# Patient Record
Sex: Female | Born: 1950 | Race: Black or African American | Hispanic: No | Marital: Married | Smoking: Current every day smoker
Health system: Southern US, Community
[De-identification: ages and names within clinical notes are randomized; demographics above are authoritative.]

## PROBLEM LIST (undated history)

## (undated) DIAGNOSIS — J40 Bronchitis, not specified as acute or chronic: Secondary | ICD-10-CM

## (undated) DIAGNOSIS — F419 Anxiety disorder, unspecified: Secondary | ICD-10-CM

## (undated) HISTORY — PX: HAND SURGERY: SHX662

---

## 2001-07-20 ENCOUNTER — Other Ambulatory Visit: Admission: RE | Admit: 2001-07-20 | Discharge: 2001-07-20 | Payer: Self-pay | Admitting: Family Medicine

## 2004-04-15 ENCOUNTER — Ambulatory Visit (HOSPITAL_COMMUNITY): Admission: RE | Admit: 2004-04-15 | Discharge: 2004-04-15 | Payer: Self-pay | Admitting: Family Medicine

## 2005-10-20 ENCOUNTER — Ambulatory Visit (HOSPITAL_COMMUNITY): Admission: RE | Admit: 2005-10-20 | Discharge: 2005-10-20 | Payer: Self-pay | Admitting: Family Medicine

## 2010-08-04 ENCOUNTER — Encounter: Payer: Self-pay | Admitting: Family Medicine

## 2010-08-05 ENCOUNTER — Encounter: Payer: Self-pay | Admitting: Family Medicine

## 2014-09-29 ENCOUNTER — Encounter (HOSPITAL_COMMUNITY): Payer: Self-pay | Admitting: *Deleted

## 2014-09-29 ENCOUNTER — Emergency Department (HOSPITAL_COMMUNITY)
Admission: EM | Admit: 2014-09-29 | Discharge: 2014-09-29 | Disposition: A | Discharge status: Home / Self Care | Payer: Medicare HMO | Attending: Emergency Medicine | Admitting: Emergency Medicine

## 2014-09-29 DIAGNOSIS — Y92410 Unspecified street and highway as the place of occurrence of the external cause: Secondary | ICD-10-CM | POA: Diagnosis not present

## 2014-09-29 DIAGNOSIS — S4991XA Unspecified injury of right shoulder and upper arm, initial encounter: Secondary | ICD-10-CM | POA: Insufficient documentation

## 2014-09-29 DIAGNOSIS — F419 Anxiety disorder, unspecified: Secondary | ICD-10-CM | POA: Diagnosis not present

## 2014-09-29 DIAGNOSIS — Z720 Tobacco use: Secondary | ICD-10-CM | POA: Diagnosis not present

## 2014-09-29 DIAGNOSIS — Y998 Other external cause status: Secondary | ICD-10-CM | POA: Diagnosis not present

## 2014-09-29 DIAGNOSIS — Y9389 Activity, other specified: Secondary | ICD-10-CM | POA: Insufficient documentation

## 2014-09-29 DIAGNOSIS — M25511 Pain in right shoulder: Secondary | ICD-10-CM

## 2014-09-29 HISTORY — DX: Anxiety disorder, unspecified: F41.9

## 2014-09-29 MED ORDER — ACETAMINOPHEN 500 MG PO TABS
1000.0000 mg | ORAL_TABLET | Freq: Once | ORAL | Status: AC
  Administered 2014-09-29: 1000 mg via ORAL
  Filled 2014-09-29: qty 2

## 2014-09-29 MED ORDER — METHOCARBAMOL 500 MG PO TABS: 500.0000 mg | ORAL_TABLET | Freq: Three times a day (TID) | ORAL | Status: DC
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
## 2014-09-29 NOTE — ED Notes (Signed)
Pt alert & oriented x4, stable gait. Patient given discharge instructions, paperwork & prescription(s). Patient  instructed to stop at the registration desk to finish any additional paperwork. Patient verbalized understanding. Pt left department w/ no further questions. 
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
## 2014-11-08 ENCOUNTER — Ambulatory Visit (HOSPITAL_COMMUNITY)
Admission: RE | Admit: 2014-11-08 | Discharge: 2014-11-08 | Disposition: A | Discharge status: Home / Self Care | Payer: Medicare HMO | Source: Ambulatory Visit | Attending: Family Medicine | Admitting: Family Medicine

## 2014-11-08 ENCOUNTER — Other Ambulatory Visit (HOSPITAL_COMMUNITY): Payer: Self-pay | Admitting: Family Medicine

## 2014-11-08 DIAGNOSIS — M542 Cervicalgia: Secondary | ICD-10-CM

## 2014-11-08 DIAGNOSIS — M5032 Other cervical disc degeneration, mid-cervical region: Secondary | ICD-10-CM | POA: Insufficient documentation

## 2014-11-08 DIAGNOSIS — M25511 Pain in right shoulder: Secondary | ICD-10-CM | POA: Insufficient documentation

## 2015-01-15 ENCOUNTER — Emergency Department (HOSPITAL_COMMUNITY): Payer: Medicare HMO

## 2015-01-15 ENCOUNTER — Emergency Department (HOSPITAL_COMMUNITY)
Admission: EM | Admit: 2015-01-15 | Discharge: 2015-01-15 | Disposition: A | Discharge status: Home / Self Care | Payer: Medicare HMO | Attending: Emergency Medicine | Admitting: Emergency Medicine

## 2015-01-15 ENCOUNTER — Encounter (HOSPITAL_COMMUNITY): Payer: Self-pay | Admitting: Emergency Medicine

## 2015-01-15 DIAGNOSIS — R05 Cough: Secondary | ICD-10-CM | POA: Diagnosis present

## 2015-01-15 DIAGNOSIS — Z79899 Other long term (current) drug therapy: Secondary | ICD-10-CM | POA: Diagnosis not present

## 2015-01-15 DIAGNOSIS — Z8659 Personal history of other mental and behavioral disorders: Secondary | ICD-10-CM | POA: Diagnosis not present

## 2015-01-15 DIAGNOSIS — J209 Acute bronchitis, unspecified: Secondary | ICD-10-CM | POA: Insufficient documentation

## 2015-01-15 DIAGNOSIS — Z720 Tobacco use: Secondary | ICD-10-CM | POA: Insufficient documentation

## 2015-01-15 MED ORDER — ALBUTEROL SULFATE HFA 108 (90 BASE) MCG/ACT IN AERS
2.0000 | INHALATION_SPRAY | RESPIRATORY_TRACT | Status: DC | PRN
  Administered 2015-01-15: 2 via RESPIRATORY_TRACT
  Filled 2015-01-15: qty 6.7

## 2015-01-15 MED ORDER — PREDNISONE 10 MG PO TABS: 20.0000 mg | ORAL_TABLET | Freq: Two times a day (BID) | ORAL | Status: DC

## 2015-01-15 MED ORDER — ALBUTEROL SULFATE (2.5 MG/3ML) 0.083% IN NEBU
5.0000 mg | INHALATION_SOLUTION | Freq: Once | RESPIRATORY_TRACT | Status: AC
  Administered 2015-01-15: 5 mg via RESPIRATORY_TRACT
  Filled 2015-01-15: qty 6

## 2015-01-15 MED ORDER — AZITHROMYCIN 250 MG PO TABS: ORAL_TABLET | ORAL | Status: DC

## 2015-01-15 NOTE — ED Notes (Signed)
Patient has been on cough medication for symptoms. Robitussin DM.

## 2015-01-15 NOTE — Discharge Instructions (Signed)
Zithromax and prednisone as prescribed.  Albuterol MDI: 2 puffs every 4 hours as needed for wheezing.   Acute Bronchitis Bronchitis is inflammation of the airways that extend from the windpipe into the lungs (bronchi). The inflammation often causes mucus to develop. This leads to a cough, which is the most common symptom of bronchitis.  In acute bronchitis, the condition usually develops suddenly and goes away over time, usually in a couple weeks. Smoking, allergies, and asthma can make bronchitis worse. Repeated episodes of bronchitis may cause further lung problems.  CAUSES Acute bronchitis is most often caused by the same virus that causes a cold. The virus can spread from person to person (contagious) through coughing, sneezing, and touching contaminated objects. SIGNS AND SYMPTOMS   Cough.   Fever.   Coughing up mucus.   Body aches.   Chest congestion.   Chills.   Shortness of breath.   Sore throat.  DIAGNOSIS  Acute bronchitis is usually diagnosed through a physical exam. Your health care provider will also ask you questions about your medical history. Tests, such as chest X-rays, are sometimes done to rule out other conditions.  TREATMENT  Acute bronchitis usually goes away in a couple weeks. Oftentimes, no medical treatment is necessary. Medicines are sometimes given for relief of fever or cough. Antibiotic medicines are usually not needed but may be prescribed in certain situations. In some cases, an inhaler may be recommended to help reduce shortness of breath and control the cough. A cool mist vaporizer may also be used to help thin bronchial secretions and make it easier to clear the chest.  HOME CARE INSTRUCTIONS  Get plenty of rest.   Drink enough fluids to keep your urine clear or pale yellow (unless you have a medical condition that requires fluid restriction). Increasing fluids may help thin your respiratory secretions (sputum) and reduce chest congestion,  and it will prevent dehydration.   Take medicines only as directed by your health care provider.  If you were prescribed an antibiotic medicine, finish it all even if you start to feel better.  Avoid smoking and secondhand smoke. Exposure to cigarette smoke or irritating chemicals will make bronchitis worse. If you are a smoker, consider using nicotine gum or skin patches to help control withdrawal symptoms. Quitting smoking will help your lungs heal faster.   Reduce the chances of another bout of acute bronchitis by washing your hands frequently, avoiding people with cold symptoms, and trying not to touch your hands to your mouth, nose, or eyes.   Keep all follow-up visits as directed by your health care provider.  SEEK MEDICAL CARE IF: Your symptoms do not improve after 1 week of treatment.  SEEK IMMEDIATE MEDICAL CARE IF:  You develop an increased fever or chills.   You have chest pain.   You have severe shortness of breath.  You have bloody sputum.   You develop dehydration.  You faint or repeatedly feel like you are going to pass out.  You develop repeated vomiting.  You develop a severe headache. MAKE SURE YOU:   Understand these instructions.  Will watch your condition.  Will get help right away if you are not doing well or get worse. Document Released: 08/07/2004 Document Revised: 11/14/2013 Document Reviewed: 12/21/2012 [facility]ExitCare Patient Information 2015 [place]ExitCare, [place]LL[place]. This information is not intended to replace advice given to you by your health care provider. Make sure you discuss any questions you have with your health care provider.

## 2015-01-15 NOTE — ED Provider Notes (Signed)
CSN: [id number]     Arrival date & time 01/15/15  1034 History  This chart was scribed for Geoffery Lyons, MD by Freida Busman, ED Scribe. This patient was seen in room APA12/APA12 and the patient's care was started 11:03 AM.    Chief Complaint  Patient presents with  . Cough    Patient is a 64 y.o. female presenting with cough. The history is provided by the patient. No language interpreter was used.  Cough Cough characteristics:  Productive Timing:  Constant Progression:  Worsening Smoker: yes   Relieved by:  Nothing Associated symptoms: no chest pain, no chills and no fever      HPI Comments:  Rita Yang is a 64 y.o. female who presents to the Emergency Department complaining of a productive cough for ~2-3 weeks that worsened last night. She denies fever, lower extremity pain or swelling and CP. Pt denies h/o COPD, asthma and CHF. Pt is currently a smoker, smokes ~1 ppd. No alleviating factors noted.   Past Medical History  Diagnosis Date  . Anxiety    Past Surgical History  Procedure Laterality Date  . Hand surgery     No family history on file. History  Substance Use Topics  . Smoking status: Current Every Day Smoker -- 0.50 packs/day    Types: Cigarettes  . Smokeless tobacco: Not on file  . Alcohol Use: No   OB History    No data available     Review of Systems  Constitutional: Negative for fever and chills.  Respiratory: Positive for cough.   Cardiovascular: Negative for chest pain and leg swelling.  All other systems reviewed and are negative.     Allergies  Other  Home Medications   Prior to Admission medications   Medication Sig Start Date End Date Taking? Authorizing Provider  methocarbamol (ROBAXIN) 500 MG tablet Take 1 tablet (500 mg total) by mouth 3 (three) times daily. 09/29/14   Ivery Quale, PA-C   BP 190/108 mmHg  Pulse 88  Temp(Src) 98 F (36.7 C) (Oral)  Resp 22  Ht  (1.575 m)  Wt 95 lb (43.092 kg)  BMI 17.37 kg/m2  SpO2  97% Physical Exam  Constitutional: She is oriented to person, place, and time. She appears well-developed and well-nourished. No distress.  HENT:  Head: Normocephalic and atraumatic.  Eyes: EOM are normal.  Neck: Normal range of motion.  Cardiovascular: Normal rate, regular rhythm and normal heart sounds.   Pulmonary/Chest: Effort normal. She has rales.  Slight rhonchi   Abdominal: Soft. She exhibits no distension. There is no tenderness.  Musculoskeletal: Normal range of motion. She exhibits no edema.  Neurological: She is alert and oriented to person, place, and time.  Skin: Skin is warm and dry.  Psychiatric: She has a normal mood and affect. Judgment normal.  Nursing note and vitals reviewed.   ED Course  Procedures  DIAGNOSTIC STUDIES:  Oxygen Saturation is 97% on RA, normal by my interpretation.    COORDINATION OF CARE:  11:05 AM Will order breathing treatment. Will discharge pt with inhaler, abx and steroid. Smoking cessation given. Discussed treatment plan with pt at bedside and pt agreed to plan.  Labs Review Labs Reviewed - No data to display  Imaging Review No results found.   EKG Interpretation None      MDM   Final diagnoses:  None    Patient presents with productive cough for the past 2 weeks unrelieved with over-the-counter medications. X-ray reveals no acute  infiltrate. Due to the length of symptoms and lack of response to over-the-counter medications, I will treat with Zithromax and when necessary return. The patient has been advised to stop smoking.  I personally performed the services described in this documentation, which was scribed in my presence. The recorded information has been reviewed and is accurate.      Geoffery Lyonsouglas Indalecio Malmstrom, MD 01/17/15 [phone]

## 2015-01-15 NOTE — ED Notes (Signed)
Patient with no complaints at this time. Respirations even and unlabored. Skin warm/dry. Discharge instructions reviewed with patient at this time. Patient given opportunity to voice concerns/ask questions. Patient discharged at this time and left Emergency Department with steady gait.   

## 2015-01-15 NOTE — ED Notes (Signed)
Cough x 2 weeks, worse last night. Denies pain or shortness of breath.

## 2015-01-15 NOTE — ED Notes (Signed)
Respiratory paged for administration of breathing tx and inhaler.

## 2015-05-09 ENCOUNTER — Emergency Department (HOSPITAL_COMMUNITY): Payer: Medicare HMO

## 2015-05-09 ENCOUNTER — Encounter (HOSPITAL_COMMUNITY): Payer: Self-pay | Admitting: Emergency Medicine

## 2015-05-09 ENCOUNTER — Emergency Department (HOSPITAL_COMMUNITY)
Admission: EM | Admit: 2015-05-09 | Discharge: 2015-05-09 | Disposition: A | Discharge status: Home / Self Care | Payer: Medicare HMO | Attending: Emergency Medicine | Admitting: Emergency Medicine

## 2015-05-09 DIAGNOSIS — Z79899 Other long term (current) drug therapy: Secondary | ICD-10-CM | POA: Insufficient documentation

## 2015-05-09 DIAGNOSIS — Z7952 Long term (current) use of systemic steroids: Secondary | ICD-10-CM | POA: Insufficient documentation

## 2015-05-09 DIAGNOSIS — J159 Unspecified bacterial pneumonia: Secondary | ICD-10-CM | POA: Insufficient documentation

## 2015-05-09 DIAGNOSIS — R05 Cough: Secondary | ICD-10-CM | POA: Diagnosis present

## 2015-05-09 DIAGNOSIS — J189 Pneumonia, unspecified organism: Secondary | ICD-10-CM

## 2015-05-09 DIAGNOSIS — Z8659 Personal history of other mental and behavioral disorders: Secondary | ICD-10-CM | POA: Insufficient documentation

## 2015-05-09 DIAGNOSIS — Z720 Tobacco use: Secondary | ICD-10-CM | POA: Insufficient documentation

## 2015-05-09 MED ORDER — DEXAMETHASONE 4 MG PO TABS
ORAL_TABLET | ORAL | Status: AC
  Filled 2015-05-09: qty 2

## 2015-05-09 MED ORDER — AZITHROMYCIN 250 MG PO TABS: 250.0000 mg | ORAL_TABLET | Freq: Every day | ORAL | Status: DC

## 2015-05-09 MED ORDER — DEXAMETHASONE 4 MG PO TABS
12.0000 mg | ORAL_TABLET | Freq: Once | ORAL | Status: AC
  Administered 2015-05-09: 12 mg via ORAL

## 2015-05-09 MED ORDER — AZITHROMYCIN 250 MG PO TABS
500.0000 mg | ORAL_TABLET | Freq: Once | ORAL | Status: AC
  Administered 2015-05-09: 500 mg via ORAL
  Filled 2015-05-09: qty 2

## 2015-05-09 MED ORDER — CEFTRIAXONE SODIUM 1 G IJ SOLR
1.0000 g | Freq: Once | INTRAMUSCULAR | Status: AC
  Administered 2015-05-09: 1 g via INTRAMUSCULAR
  Filled 2015-05-09: qty 10

## 2015-05-09 MED ORDER — ALBUTEROL SULFATE HFA 108 (90 BASE) MCG/ACT IN AERS
2.0000 | INHALATION_SPRAY | Freq: Once | RESPIRATORY_TRACT | Status: AC
  Administered 2015-05-09: 2 via RESPIRATORY_TRACT
  Filled 2015-05-09: qty 6.7

## 2015-05-09 MED ORDER — LIDOCAINE HCL (PF) 1 % IJ SOLN
INTRAMUSCULAR | Status: AC
  Filled 2015-05-09: qty 5

## 2015-05-09 MED ORDER — IPRATROPIUM-ALBUTEROL 0.5-2.5 (3) MG/3ML IN SOLN
3.0000 mL | Freq: Once | RESPIRATORY_TRACT | Status: AC
  Administered 2015-05-09: 3 mL via RESPIRATORY_TRACT
  Filled 2015-05-09: qty 3

## 2015-05-09 NOTE — ED Provider Notes (Signed)
CSN: [id number]     Arrival date & time 05/09/15  1222 History   First MD Initiated Contact with Patient 05/09/15 1236     Chief Complaint  Patient presents with  . Cough     (Consider location/radiation/quality/duration/timing/severity/associated sxs/prior Treatment) HPI  64 year old female with cough. Onset a few days to come progressively worsening. Nonproductive. No fevers or chills. No hemoptysis.  associated with nasal congestion. Denies any chest pain. No unusual leg pain or swelling. Denies any diagnosed history of underlying lung disease though she does have a long smoking history. No intervention prior to arrival.  Past Medical History  Diagnosis Date  . Anxiety    Past Surgical History  Procedure Laterality Date  . Hand surgery     History reviewed. No pertinent family history. Social History  Substance Use Topics  . Smoking status: Current Every Day Smoker -- 0.50 packs/day    Types: Cigarettes  . Smokeless tobacco: None  . Alcohol Use: No   OB History    No data available     Review of Systems  All systems reviewed and negative, other than as noted in HPI.   Allergies  Other  Home Medications   Prior to Admission medications   Medication Sig Start Date End Date Taking? Authorizing Provider  azithromycin (ZITHROMAX Z-PAK) 250 MG tablet 2 po day one, then 1 daily x 4 days 01/15/15   Geoffery Lyonsouglas Delo, MD  methocarbamol (ROBAXIN) 500 MG tablet Take 1 tablet (500 mg total) by mouth 3 (three) times daily. 09/29/14   Ivery QualeHobson Bryant, PA-C  predniSONE (DELTASONE) 10 MG tablet Take 2 tablets (20 mg total) by mouth 2 (two) times daily. 01/15/15   Geoffery Lyonsouglas Delo, MD   BP 130/71 mmHg  Pulse 83  Temp(Src) 97.9 F (36.6 C) (Oral)  Resp 18  Ht 5\' 2"  (1.575 m)  Wt 87 lb (39.463 kg)  BMI 15.91 kg/m2  SpO2 100% Physical Exam  Constitutional: She appears well-developed and well-nourished. No distress.  HENT:  Head: Normocephalic and atraumatic.  Eyes: Conjunctivae are  normal. Right eye exhibits no discharge. Left eye exhibits no discharge.  Neck: Neck supple.  Cardiovascular: Normal rate, regular rhythm and normal heart sounds.  Exam reveals no gallop and no friction rub.   No murmur heard. Pulmonary/Chest: Effort normal. No respiratory distress. She has wheezes.  Speaking in complete sentences. No excessive muscle usage. Faint bilateral expiratory wheezing  Abdominal: Soft. She exhibits no distension. There is no tenderness.  Musculoskeletal: She exhibits no edema or tenderness.  Lower extremities symmetric as compared to each other. No calf tenderness. Negative Homan's. No palpable cords.   Neurological: She is alert.  Skin: Skin is warm and dry.  Psychiatric: She has a normal mood and affect. Her behavior is normal. Thought content normal.  Nursing note and vitals reviewed.   ED Course  Procedures (including critical care time) Labs Review Labs Reviewed - No data to display  Imaging Review No results found. I have personally reviewed and evaluated these images and lab results as part of my medical decision-making.   EKG Interpretation None      MDM   Final diagnoses:  CAP (community acquired pneumonia)    64y female with cough. Imaging consistent with pneumonia. No increased work of breathing. I feel she is appropriate for outpatient treatment.  Raeford RazorStephen Rylan Bernard, MD 05/20/15 2158

## 2015-05-09 NOTE — Discharge Instructions (Signed)

## 2015-05-09 NOTE — ED Notes (Signed)
Pt states that she has had a bad cough for the past few days and has had a runny nose.

## 2015-05-09 NOTE — ED Notes (Signed)
Patient with no complaints at this time. Respirations even and unlabored. Skin warm/dry. Discharge instructions reviewed with patient at this time. Patient given opportunity to voice concerns/ask questions. Patient discharged at this time and left Emergency Department with steady gait.   

## 2015-08-14 ENCOUNTER — Encounter (HOSPITAL_COMMUNITY): Payer: Self-pay

## 2015-08-14 ENCOUNTER — Emergency Department (HOSPITAL_COMMUNITY): Payer: Medicare HMO

## 2015-08-14 ENCOUNTER — Emergency Department (HOSPITAL_COMMUNITY)
Admission: EM | Admit: 2015-08-14 | Discharge: 2015-08-14 | Disposition: A | Discharge status: Home / Self Care | Payer: Medicare HMO | Attending: Emergency Medicine | Admitting: Emergency Medicine

## 2015-08-14 DIAGNOSIS — R079 Chest pain, unspecified: Secondary | ICD-10-CM | POA: Diagnosis not present

## 2015-08-14 DIAGNOSIS — Z8659 Personal history of other mental and behavioral disorders: Secondary | ICD-10-CM | POA: Insufficient documentation

## 2015-08-14 DIAGNOSIS — F17210 Nicotine dependence, cigarettes, uncomplicated: Secondary | ICD-10-CM | POA: Insufficient documentation

## 2015-08-14 DIAGNOSIS — Z79899 Other long term (current) drug therapy: Secondary | ICD-10-CM | POA: Diagnosis not present

## 2015-08-14 DIAGNOSIS — Z8709 Personal history of other diseases of the respiratory system: Secondary | ICD-10-CM | POA: Diagnosis not present

## 2015-08-14 HISTORY — DX: Bronchitis, not specified as acute or chronic: J40

## 2015-08-14 LAB — CBC
HCT: 42.4 % (ref 36.0–46.0)
Hemoglobin: 14.1 g/dL (ref 12.0–15.0)
MCH: 31.3 pg (ref 26.0–34.0)
MCHC: 33.3 g/dL (ref 30.0–36.0)
MCV: 94 fL (ref 78.0–100.0)
PLATELETS: 235 10*3/uL (ref 150–400)
RBC: 4.51 MIL/uL (ref 3.87–5.11)
RDW: 13.7 % (ref 11.5–15.5)
WBC: 6.3 10*3/uL (ref 4.0–10.5)

## 2015-08-14 LAB — BASIC METABOLIC PANEL
Anion gap: 12 (ref 5–15)
BUN: 14 mg/dL (ref 6–20)
CALCIUM: 9.2 mg/dL (ref 8.9–10.3)
CO2: 28 mmol/L (ref 22–32)
Chloride: 99 mmol/L — ABNORMAL LOW (ref 101–111)
Creatinine, Ser: 1.21 mg/dL — ABNORMAL HIGH (ref 0.44–1.00)
GFR calc Af Amer: 53 mL/min — ABNORMAL LOW (ref >60)
GFR, EST NON AFRICAN AMERICAN: 46 mL/min — AB (ref >60)
Glucose, Bld: 106 mg/dL — ABNORMAL HIGH (ref 65–99)
Potassium: 3.4 mmol/L — ABNORMAL LOW (ref 3.5–5.1)
Sodium: 139 mmol/L (ref 135–145)

## 2015-08-14 LAB — TROPONIN I
Troponin I: <0.03 ng/mL (ref <0.031)
Troponin I: <0.03 ng/mL (ref <0.031)

## 2015-08-14 MED ORDER — IBUPROFEN 800 MG PO TABS: 800.0000 mg | ORAL_TABLET | Freq: Three times a day (TID) | ORAL | Status: DC

## 2015-08-14 MED ORDER — TRAMADOL HCL 50 MG PO TABS
50.0000 mg | ORAL_TABLET | Freq: Once | ORAL | Status: AC
  Administered 2015-08-14: 50 mg via ORAL
  Filled 2015-08-14: qty 1

## 2015-08-14 NOTE — Discharge Instructions (Signed)
Take a baby aspirin every day  Please obtain all of your results from medical records or have your doctors office obtain the results - share them with your doctor - you should be seen at your doctors office in the next 2 days. Call today to arrange your follow up. Take the medications as prescribed. Please review all of the medicines and only take them if you do not have an allergy to them. Please be aware that if you are taking birth control pills, taking other prescriptions, ESPECIALLY ANTIBIOTICS may make the birth control ineffective - if this is the case, either do not engage in sexual activity or use alternative methods of birth control such as condoms until you have finished the medicine and your family doctor says it is OK to restart them. If you are on a blood thinner such as COUMADIN, be aware that any other medicine that you take may cause the coumadin to either work too much, or not enough - you should have your coumadin level rechecked in next 7 days if this is the case.  ?  It is also a possibility that you have an allergic reaction to any of the medicines that you have been prescribed - Everybody reacts differently to medications and while MOST people have no trouble with most medicines, you may have a reaction such as nausea, vomiting, rash, swelling, shortness of breath. If this is the case, please stop taking the medicine immediately and contact your physician.  ?  You should return to the ER if you develop severe or worsening symptoms.

## 2015-08-14 NOTE — ED Provider Notes (Signed)
CSN: [id number]     Arrival date & time 08/14/15  1648 History   First MD Initiated Contact with Patient 08/14/15 1715     Chief Complaint  Patient presents with  . left side pain      (Consider location/radiation/quality/duration/timing/severity/associated sxs/prior Treatment) HPI Comments: The patient is a 65 year old female, she is a patient of Dr. Sudie Bailey, she has no known cardiac history but states that she does have bronchitis and smokes half a pack of cigarettes per day. She denies taking any other medications chronically, has had 3 days of chest pain which she states started on Sunday, was worse in the morning, dissipated throughout the day and finally went away by evening, yesterday the symptoms were the same, today her symptoms have not dissipated and she has persistent left-sided abnormal feeling in her chest. She cannot reproduce it, it is not musculoskeletal, is not related to breathing, she has no coughing or fever, she has no other risk factors for pulmonary embolism. She does report that her daughter has significant heart problems as well as high blood pressure and congestive heart failure. The patient has no radiation of this pain, to her shoulder neck or jaw, she has no back pain, she has no dyspnea on exertion.  The history is provided by the patient.    Past Medical History  Diagnosis Date  . Anxiety   . Bronchitis    Past Surgical History  Procedure Laterality Date  . Hand surgery     No family history on file. Social History  Substance Use Topics  . Smoking status: Current Every Day Smoker -- 0.50 packs/day    Types: Cigarettes  . Smokeless tobacco: None  . Alcohol Use: No   OB History    No data available     Review of Systems  All other systems reviewed and are negative.     Allergies  Other  Home Medications   Prior to Admission medications   Medication Sig Start Date End Date Taking? Authorizing Provider  acetaminophen (TYLENOL) 325 MG  tablet Take 650 mg by mouth every 6 (six) hours as needed for mild pain or moderate pain.   Yes Historical Provider, MD  albuterol (PROVENTIL HFA;VENTOLIN HFA) 108 (90 Base) MCG/ACT inhaler Inhale 1-2 puffs into the lungs every 6 (six) hours as needed for wheezing or shortness of breath.   Yes Historical Provider, MD  Aspirin-Salicylamide-Caffeine (BC HEADACHE) 325-95-16 MG TABS Take 1 packet by mouth daily as needed (for pain).   Yes Historical Provider, MD  diazepam (VALIUM) 5 MG tablet Take 1 tablet by mouth 3 (three) times daily. 05/01/15  Yes Historical Provider, MD  azithromycin (ZITHROMAX) 250 MG tablet Take 1 tablet (250 mg total) by mouth daily. Take first 2 tablets together, then 1 every day until finished. Patient not taking: Reported on 08/14/2015 05/09/15   Raeford Razor, MD  ibuprofen (ADVIL,MOTRIN) 800 MG tablet Take 1 tablet (800 mg total) by mouth 3 (three) times daily. 08/14/15   Eber Hong, MD   BP 159/91 mmHg  Pulse 56  Temp(Src) 99.2 F (37.3 C) (Tympanic)  Resp 20  Ht  (1.575 m)  Wt 85 lb (38.556 kg)  BMI 15.54 kg/m2  SpO2 100% Physical Exam  Constitutional: She appears well-developed and well-nourished. No distress.  HENT:  Head: Normocephalic and atraumatic.  Mouth/Throat: Oropharynx is clear and moist. No oropharyngeal exudate.  Eyes: Conjunctivae and EOM are normal. Pupils are equal, round, and reactive to light. Right eye exhibits no  discharge. Left eye exhibits no discharge. No scleral icterus.  Neck: Normal range of motion. Neck supple. No JVD present. No thyromegaly present.  Cardiovascular: Normal rate, regular rhythm, normal heart sounds and intact distal pulses.  Exam reveals no gallop and no friction rub.   No murmur heard. Pulmonary/Chest: Effort normal and breath sounds normal. No respiratory distress. She has no wheezes. She has no rales. She exhibits no tenderness.  Abdominal: Soft. Bowel sounds are normal. She exhibits no distension and no mass.  There is no tenderness.  Musculoskeletal: Normal range of motion. She exhibits no edema or tenderness.  Lymphadenopathy:    She has no cervical adenopathy.  Neurological: She is alert. Coordination normal.  Skin: Skin is warm and dry. No rash noted. No erythema.  Psychiatric: She has a normal mood and affect. Her behavior is normal.  Nursing note and vitals reviewed.   ED Course  Procedures (including critical care time) Labs Review Labs Reviewed  BASIC METABOLIC PANEL - Abnormal; Notable for the following:    Potassium 3.4 (*)    Chloride 99 (*)    Glucose, Bld 106 (*)    Creatinine, Ser 1.21 (*)    GFR calc non Af Amer 46 (*)    GFR calc Af Amer 53 (*)    All other components within normal limits  CBC  TROPONIN I  TROPONIN I    Imaging Review Dg Ribs Unilateral W/chest Left  08/14/2015  CLINICAL DATA:  Left rib pain since Sunday EXAM: LEFT RIBS AND CHEST - 3+ VIEW COMPARISON:  05/09/2015 FINDINGS: No fracture or other bone lesions are seen involving the ribs. There is no evidence of pneumothorax or pleural effusion. Both lungs are clear. Heart size and mediastinal contours are within normal limits. Aortic atherosclerosis noted. IMPRESSION: 1. No acute findings. 2. Aortic atherosclerosis noted. Electronically Signed   By: Signa Kell M.D.   On: 08/14/2015 18:04   I have personally reviewed and evaluated these images and lab results as part of my medical decision-making.   EKG Interpretation   Date/Time:  Tuesday August 14 2015 17:11:18 EST Ventricular Rate:  76 PR Interval:  150 QRS Duration: 83 QT Interval:  397 QTC Calculation: 446 R Axis:   88 Text Interpretation:  Sinus rhythm Probable left atrial enlargement  Borderline right axis deviation Left ventricular hypertrophy Abnrm T,  consider ischemia, anterolateral lds No old tracing to compare Confirmed  by Miriah Maruyama  MD, Kamylle Axelson ([id number]) on 08/14/2015 5:41:42 PM      MDM   Final diagnoses:  Chest pain,  unspecified chest pain type    The patient has no reproducible tenderness over her chest to palpation, I cannot make her feel his pain, when she takes a deep breath it doesn't hurt, her EKG is very abnormal showing signs of left ventricular hypertrophy but also with T wave abnormalities in the precordial leads that raise suspicion for ischemia though this could just be repolarization abnormality. There is no old EKG with which to compare this. I would have to suspect that the patient's symptoms are related to some underlying cause, they're not musculoskeletal, we'll obtain a chest x-ray and a laboratory evaluation including a troponin.   2nd trop neg, has been having pain over 12 hours - no changes, pain continues to improve - VS without acute findings other than mild htn - pt will see PCP this week, agreeable to my verbal instructions and voices understanding of the indiacdtion for return.  Eber Hong, MD 08/14/15  2107 

## 2015-08-14 NOTE — ED Notes (Signed)
Pt reports pain in left ribs x 3 days.  Denies chest pain or sob.  Reports pain is worse when she first wakes up but has been nagging all day.

## 2015-08-18 ENCOUNTER — Encounter (HOSPITAL_COMMUNITY): Payer: Self-pay | Admitting: Emergency Medicine

## 2015-08-18 ENCOUNTER — Emergency Department (HOSPITAL_COMMUNITY)
Admission: EM | Admit: 2015-08-18 | Discharge: 2015-08-18 | Disposition: A | Discharge status: Home / Self Care | Payer: Medicare HMO | Attending: Emergency Medicine | Admitting: Emergency Medicine

## 2015-08-18 DIAGNOSIS — Z79899 Other long term (current) drug therapy: Secondary | ICD-10-CM | POA: Diagnosis not present

## 2015-08-18 DIAGNOSIS — F419 Anxiety disorder, unspecified: Secondary | ICD-10-CM | POA: Diagnosis not present

## 2015-08-18 DIAGNOSIS — N390 Urinary tract infection, site not specified: Secondary | ICD-10-CM | POA: Diagnosis not present

## 2015-08-18 DIAGNOSIS — Z791 Long term (current) use of non-steroidal anti-inflammatories (NSAID): Secondary | ICD-10-CM | POA: Diagnosis not present

## 2015-08-18 DIAGNOSIS — R1012 Left upper quadrant pain: Secondary | ICD-10-CM | POA: Diagnosis present

## 2015-08-18 DIAGNOSIS — Z8709 Personal history of other diseases of the respiratory system: Secondary | ICD-10-CM | POA: Insufficient documentation

## 2015-08-18 DIAGNOSIS — F17210 Nicotine dependence, cigarettes, uncomplicated: Secondary | ICD-10-CM | POA: Insufficient documentation

## 2015-08-18 LAB — URINE MICROSCOPIC-ADD ON

## 2015-08-18 LAB — URINALYSIS, ROUTINE W REFLEX MICROSCOPIC
BILIRUBIN URINE: NEGATIVE
GLUCOSE, UA: NEGATIVE mg/dL
Nitrite: NEGATIVE
PH: 5.5 (ref 5.0–8.0)
PROTEIN: NEGATIVE mg/dL
Specific Gravity, Urine: 1.015 (ref 1.005–1.030)

## 2015-08-18 MED ORDER — CEPHALEXIN 500 MG PO CAPS
500.0000 mg | ORAL_CAPSULE | Freq: Once | ORAL | Status: AC
  Administered 2015-08-18: 500 mg via ORAL
  Filled 2015-08-18: qty 1

## 2015-08-18 MED ORDER — CEPHALEXIN 500 MG PO CAPS: 500.0000 mg | ORAL_CAPSULE | Freq: Four times a day (QID) | ORAL | Status: DC

## 2015-08-18 NOTE — ED Notes (Signed)
Seen in [facility] on Tuesday for left flank pain and has not gotten any better.  Rates pain 5/10.  Was given pain medication last visit, which did decrease pain.

## 2015-08-18 NOTE — Discharge Instructions (Signed)

## 2015-08-18 NOTE — ED Provider Notes (Signed)
CSN: [id number]     Arrival date & time 08/18/15  1526 History   First MD Initiated Contact with Patient 08/18/15 1814     Chief Complaint  Patient presents with  . Flank Pain    left     (Consider location/radiation/quality/duration/timing/severity/associated sxs/prior Treatment) HPI  Rita Yang is a 65 y.o. female who presents for evaluation of left-sided pain, upper abdomen to flank, present for 1 week, noticed mostly in the morning when she urinates. He has some mild urinary dysuria. She denies urinary frequency, hematuria, nausea, vomiting, fever or chills. She was evaluated several days ago for chest pain, without abnormal findings. She has not followed up yet with her PCP, as directed. At that time, she was prescribed ibuprofen, which she takes for the left flank pain, with improvement. No other recent medical illnesses or urinary tract problems. There are no other known modifying factors.    Past Medical History  Diagnosis Date  . Anxiety   . Bronchitis    Past Surgical History  Procedure Laterality Date  . Hand surgery     History reviewed. No pertinent family history. Social History  Substance Use Topics  . Smoking status: Current Every Day Smoker -- 0.50 packs/day    Types: Cigarettes  . Smokeless tobacco: None  . Alcohol Use: No   OB History    No data available     Review of Systems  All other systems reviewed and are negative.     Allergies  Other  Home Medications   Prior to Admission medications   Medication Sig Start Date End Date Taking? Authorizing Provider  acetaminophen (TYLENOL) 325 MG tablet Take 650 mg by mouth every 6 (six) hours as needed for mild pain or moderate pain.    Historical Provider, MD  albuterol (PROVENTIL HFA;VENTOLIN HFA) 108 (90 Base) MCG/ACT inhaler Inhale 1-2 puffs into the lungs every 6 (six) hours as needed for wheezing or shortness of breath.    Historical Provider, MD  Aspirin-Salicylamide-Caffeine (BC HEADACHE)  325-95-16 MG TABS Take 1 packet by mouth daily as needed (for pain).    Historical Provider, MD  azithromycin (ZITHROMAX) 250 MG tablet Take 1 tablet (250 mg total) by mouth daily. Take first 2 tablets together, then 1 every day until finished. Patient not taking: Reported on 08/14/2015 05/09/15   Raeford Razor, MD  cephALEXin (KEFLEX) 500 MG capsule Take 1 capsule (500 mg total) by mouth 4 (four) times daily. 08/18/15   Mancel Bale, MD  diazepam (VALIUM) 5 MG tablet Take 1 tablet by mouth 3 (three) times daily. 05/01/15   Historical Provider, MD  ibuprofen (ADVIL,MOTRIN) 800 MG tablet Take 1 tablet (800 mg total) by mouth 3 (three) times daily. 08/14/15   Eber Hong, MD   BP 144/85 mmHg  Pulse 88  Temp(Src) 98.4 F (36.9 C) (Oral)  Resp 16  Ht  (1.575 m)  Wt 85 lb (38.556 kg)  BMI 15.54 kg/m2  SpO2 100% Physical Exam  Constitutional: She is oriented to person, place, and time. She appears well-developed. No distress.  Frail, nontoxic  HENT:  Head: Normocephalic and atraumatic.  Right Ear: External ear normal.  Left Ear: External ear normal.  Eyes: Conjunctivae and EOM are normal. Pupils are equal, round, and reactive to light.  Neck: Normal range of motion and phonation normal. Neck supple.  Cardiovascular: Normal rate, regular rhythm and normal heart sounds.   Pulmonary/Chest: Effort normal and breath sounds normal. No respiratory distress. She exhibits no tenderness  and no bony tenderness.  Abdominal: Soft. She exhibits no distension. There is no tenderness.  Genitourinary:  No costovertebral angle tenderness to percussion.  Musculoskeletal: Normal range of motion.  Neurological: She is alert and oriented to person, place, and time. No cranial nerve deficit or sensory deficit. She exhibits normal muscle tone. Coordination normal.  Skin: Skin is warm, dry and intact.  Psychiatric: She has a normal mood and affect. Her behavior is normal. Judgment and thought content normal.   Nursing note and vitals reviewed.   ED Course  Procedures (including critical care time)  Medications  cephALEXin (KEFLEX) capsule 500 mg (500 mg Oral Given 08/18/15 1830)    Patient Vitals for the past 24 hrs:  BP Temp Temp src Pulse Resp SpO2 Height Weight  08/18/15 1602 144/85 mmHg 98.4 F (36.9 C) Oral 88 16 100 %  (1.575 m) 85 lb (38.556 kg)    6:38 PM Reevaluation with update and discussion. After initial assessment and treatment, an updated evaluation reveals no change in clinical status. Findings discussed with patient, all questions answered. Ellajane Stong L    Labs Review Labs Reviewed  URINALYSIS, ROUTINE W REFLEX MICROSCOPIC (NOT AT [facility]) - Abnormal; Notable for the following:    APPearance HAZY (*)    Hgb urine dipstick MODERATE (*)    Ketones, ur TRACE (*)    Leukocytes, UA TRACE (*)    All other components within normal limits  URINE MICROSCOPIC-ADD ON - Abnormal; Notable for the following:    Squamous Epithelial / LPF 6-30 (*)    Bacteria, UA MANY (*)    All other components within normal limits    Imaging Review No results found. I have personally reviewed and evaluated these images and lab results as part of my medical decision-making.   EKG Interpretation None      MDM   Final diagnoses:  Urinary tract infection without hematuria, site unspecified    Evaluation consistent with UTI, without evidence for seizures bacterial infection. Possible pyelonephritis, mild, with upper abdominal and flank discomfort. No history of urolithiasis. Patient does not appear to have symptoms of renal colic.    Nursing Notes Reviewed/ Care Coordinated Applicable Imaging Reviewed Interpretation of Laboratory Data incorporated into ED treatment  The patient appears reasonably screened and/or stabilized for discharge and I doubt any other medical condition or other [facility] requiring further screening, evaluation, or treatment in the ED at this time prior to  discharge.  Plan: Home Medications- Keflex; Home Treatments- increase fluids; return here if the recommended treatment, does not improve the symptoms; Recommended follow up- PCP 1 week and prn   Mancel Bale, MD 08/18/15 [phone]

## 2015-08-24 ENCOUNTER — Emergency Department (HOSPITAL_COMMUNITY)
Admission: EM | Admit: 2015-08-24 | Discharge: 2015-08-25 | Disposition: A | Discharge status: Home / Self Care | Payer: Medicare HMO | Attending: Emergency Medicine | Admitting: Emergency Medicine

## 2015-08-24 ENCOUNTER — Other Ambulatory Visit (HOSPITAL_COMMUNITY): Payer: Self-pay | Admitting: Family Medicine

## 2015-08-24 ENCOUNTER — Ambulatory Visit (HOSPITAL_COMMUNITY)
Admission: RE | Admit: 2015-08-24 | Discharge: 2015-08-24 | Disposition: A | Discharge status: Home / Self Care | Payer: Medicare HMO | Source: Ambulatory Visit | Attending: Family Medicine | Admitting: Family Medicine

## 2015-08-24 ENCOUNTER — Encounter (HOSPITAL_COMMUNITY): Payer: Self-pay | Admitting: *Deleted

## 2015-08-24 DIAGNOSIS — M545 Low back pain: Secondary | ICD-10-CM

## 2015-08-24 DIAGNOSIS — Z791 Long term (current) use of non-steroidal anti-inflammatories (NSAID): Secondary | ICD-10-CM | POA: Diagnosis not present

## 2015-08-24 DIAGNOSIS — R1032 Left lower quadrant pain: Secondary | ICD-10-CM

## 2015-08-24 DIAGNOSIS — Z79899 Other long term (current) drug therapy: Secondary | ICD-10-CM | POA: Diagnosis not present

## 2015-08-24 DIAGNOSIS — F17210 Nicotine dependence, cigarettes, uncomplicated: Secondary | ICD-10-CM | POA: Insufficient documentation

## 2015-08-24 DIAGNOSIS — I700 Atherosclerosis of aorta: Secondary | ICD-10-CM

## 2015-08-24 DIAGNOSIS — Z792 Long term (current) use of antibiotics: Secondary | ICD-10-CM | POA: Insufficient documentation

## 2015-08-24 DIAGNOSIS — R109 Unspecified abdominal pain: Secondary | ICD-10-CM | POA: Insufficient documentation

## 2015-08-24 DIAGNOSIS — Z8709 Personal history of other diseases of the respiratory system: Secondary | ICD-10-CM | POA: Diagnosis not present

## 2015-08-24 DIAGNOSIS — F419 Anxiety disorder, unspecified: Secondary | ICD-10-CM | POA: Diagnosis not present

## 2015-08-24 DIAGNOSIS — R350 Frequency of micturition: Secondary | ICD-10-CM | POA: Diagnosis not present

## 2015-08-24 LAB — COMPREHENSIVE METABOLIC PANEL
ALT: 12 U/L — AB (ref 14–54)
AST: 17 U/L (ref 15–41)
Albumin: 3.9 g/dL (ref 3.5–5.0)
Alkaline Phosphatase: 67 U/L (ref 38–126)
Anion gap: 8 (ref 5–15)
BUN: 15 mg/dL (ref 6–20)
CHLORIDE: 104 mmol/L (ref 101–111)
CO2: 26 mmol/L (ref 22–32)
CREATININE: 1 mg/dL (ref 0.44–1.00)
Calcium: 9.1 mg/dL (ref 8.9–10.3)
GFR calc non Af Amer: 57 mL/min — ABNORMAL LOW (ref >60)
Glucose, Bld: 99 mg/dL (ref 65–99)
POTASSIUM: 4.2 mmol/L (ref 3.5–5.1)
SODIUM: 138 mmol/L (ref 135–145)
Total Bilirubin: 0.5 mg/dL (ref 0.3–1.2)
Total Protein: 7.1 g/dL (ref 6.5–8.1)

## 2015-08-24 LAB — URINALYSIS, ROUTINE W REFLEX MICROSCOPIC
Bilirubin Urine: NEGATIVE
GLUCOSE, UA: NEGATIVE mg/dL
Ketones, ur: NEGATIVE mg/dL
LEUKOCYTES UA: NEGATIVE
Nitrite: NEGATIVE
PH: 5.5 (ref 5.0–8.0)
PROTEIN: NEGATIVE mg/dL

## 2015-08-24 LAB — CBC WITH DIFFERENTIAL/PLATELET
BASOS ABS: 0.1 10*3/uL (ref 0.0–0.1)
Basophils Relative: 1 %
EOS ABS: 0.3 10*3/uL (ref 0.0–0.7)
EOS PCT: 3 %
HCT: 36 % (ref 36.0–46.0)
Hemoglobin: 12 g/dL (ref 12.0–15.0)
Lymphocytes Relative: 17 %
Lymphs Abs: 1.6 10*3/uL (ref 0.7–4.0)
MCH: 30.9 pg (ref 26.0–34.0)
MCHC: 33.3 g/dL (ref 30.0–36.0)
MCV: 92.8 fL (ref 78.0–100.0)
Monocytes Absolute: 0.8 10*3/uL (ref 0.1–1.0)
Monocytes Relative: 9 %
Neutro Abs: 6.8 10*3/uL (ref 1.7–7.7)
Neutrophils Relative %: 70 %
PLATELETS: 249 10*3/uL (ref 150–400)
RBC: 3.88 MIL/uL (ref 3.87–5.11)
RDW: 13.7 % (ref 11.5–15.5)
WBC: 9.6 10*3/uL (ref 4.0–10.5)

## 2015-08-24 LAB — LIPASE, BLOOD: Lipase: 33 U/L (ref 11–51)

## 2015-08-24 LAB — URINE MICROSCOPIC-ADD ON

## 2015-08-24 MED ORDER — IOHEXOL 300 MG/ML  SOLN
100.0000 mL | Freq: Once | INTRAMUSCULAR | Status: AC | PRN
  Administered 2015-08-24: 100 mL via INTRAVENOUS

## 2015-08-24 MED ORDER — CYCLOBENZAPRINE HCL 10 MG PO TABS
5.0000 mg | ORAL_TABLET | Freq: Once | ORAL | Status: AC
  Administered 2015-08-24: 5 mg via ORAL
  Filled 2015-08-24: qty 1

## 2015-08-24 MED ORDER — IBUPROFEN 400 MG PO TABS
600.0000 mg | ORAL_TABLET | Freq: Once | ORAL | Status: AC
  Administered 2015-08-24: 600 mg via ORAL
  Filled 2015-08-24: qty 2

## 2015-08-24 NOTE — ED Notes (Signed)
Pt states she has been having left sided abdominal pain and had a CT done here today and was told by Dr. Sudie Bailey that she needed to come to the ED.

## 2015-08-24 NOTE — ED Provider Notes (Signed)
CSN: [id number]     Arrival date & time 08/24/15  2025 History  By signing my name below, I, Budd Palmer, attest that this documentation has been prepared under the direction and in the presence of Devoria Albe, MD at 2318. Electronically Signed: Budd Palmer, ED Scribe. 08/25/2015. 1:00 AM.    Chief Complaint  Patient presents with  . Abdominal Pain   The history is provided by the patient. No language interpreter was used.   HPI Comments: Rita Yang is a 65 y.o. female smoker at 0.5 ppd who presents to the Emergency Department complaining of intermittent, sharp, aching left-sided abdominal pain onset 1.5 weeks ago. Pt states the pain lasts for about 3 hours in the morning, then resolves for a time during the day. She reports associated urinary frequency, but states she has been drinking alot. She notes exacerbation of the pain with lying on her left side. She notes she was prescribed an antibiotic for a suspected kidney infection, as well as a pain pill, which provided some relief. She notes her antibiotics were changed 2 days ago by her PCP, which she has been taking since then. She was sent to the hospital today and had a outpatient CT scan after which she was told to come to the ED for further evaluation. She notes she is retired and is not on any other mediations. She also reports having an adverse reaction to the flu vaccine. She denies exacerbation of the pain with coughing or stretching. She also denies drinking any alcohol. Pt denies n/v, fever, dysuria, cough, or loss of appetite.   PCP Dr Sudie Bailey  Past Medical History  Diagnosis Date  . Anxiety   . Bronchitis    Past Surgical History  Procedure Laterality Date  . Hand surgery     No family history on file. Social History  Substance Use Topics  . Smoking status: Current Every Day Smoker -- 0.50 packs/day    Types: Cigarettes  . Smokeless tobacco: None  . Alcohol Use: No  retired  OB History    No data available      Review of Systems  Constitutional: Negative for fever and appetite change.  Respiratory: Negative for cough.   Gastrointestinal: Positive for abdominal pain. Negative for nausea and vomiting.  Genitourinary: Positive for frequency. Negative for dysuria.  All other systems reviewed and are negative.   Allergies  Other  Home Medications   Prior to Admission medications   Medication Sig Start Date End Date Taking? Authorizing Provider  acetaminophen (TYLENOL) 325 MG tablet Take 650 mg by mouth every 6 (six) hours as needed for mild pain or moderate pain.   Yes Historical Provider, MD  albuterol (PROVENTIL HFA;VENTOLIN HFA) 108 (90 Base) MCG/ACT inhaler Inhale 1-2 puffs into the lungs every 6 (six) hours as needed for wheezing or shortness of breath.   Yes Historical Provider, MD  Aspirin-Salicylamide-Caffeine (BC HEADACHE) 325-95-16 MG TABS Take 1 packet by mouth daily as needed (for pain).   Yes Historical Provider, MD  diazepam (VALIUM) 5 MG tablet Take 1 tablet by mouth 3 (three) times daily. 05/01/15  Yes Historical Provider, MD  ibuprofen (ADVIL,MOTRIN) 800 MG tablet Take 1 tablet (800 mg total) by mouth 3 (three) times daily. 08/14/15  Yes Eber Hong, MD  sulfamethoxazole-trimethoprim (BACTRIM DS,SEPTRA DS) 800-160 MG tablet Take 1 tablet by mouth daily. Course started on 08/22/15 08/22/15  Yes Historical Provider, MD  azithromycin (ZITHROMAX) 250 MG tablet Take 1 tablet (250 mg total) by mouth  daily. Take first 2 tablets together, then 1 every day until finished. Patient not taking: Reported on 08/14/2015 05/09/15   Raeford Razor, MD  cephALEXin (KEFLEX) 500 MG capsule Take 1 capsule (500 mg total) by mouth 4 (four) times daily. Patient not taking: Reported on 08/24/2015 08/18/15   Mancel Bale, MD   BP 102/82 mmHg  Pulse 70  Temp(Src) 97.7 F (36.5 C) (Oral)  Resp 20  Ht  (1.575 m)  Wt 90 lb (40.824 kg)  BMI 16.46 kg/m2  SpO2 100%  Vital signs normal   Physical Exam   Constitutional: She is oriented to person, place, and time. She appears well-developed and well-nourished.  Non-toxic appearance. She does not appear ill. No distress.  HENT:  Head: Normocephalic and atraumatic.  Right Ear: External ear normal.  Left Ear: External ear normal.  Nose: Nose normal. No mucosal edema or rhinorrhea.  Mouth/Throat: Oropharynx is clear and moist and mucous membranes are normal. No dental abscesses or uvula swelling.  Eyes: Conjunctivae and EOM are normal. Pupils are equal, round, and reactive to light.  Neck: Normal range of motion and full passive range of motion without pain. Neck supple.  Cardiovascular: Normal rate, regular rhythm and normal heart sounds.  Exam reveals no gallop and no friction rub.   No murmur heard. Pulmonary/Chest: Effort normal and breath sounds normal. No respiratory distress. She has no wheezes. She has no rhonchi. She has no rales. She exhibits no tenderness and no crepitus.  Abdominal: Soft. Normal appearance and bowel sounds are normal. She exhibits no distension. There is tenderness. There is no rebound and no guarding.    TTP in the L lateral abdomen, no CVA TTP  Musculoskeletal: Normal range of motion. She exhibits no edema or tenderness.  Moves all extremities well.   Neurological: She is alert and oriented to person, place, and time. She has normal strength. No cranial nerve deficit.  Skin: Skin is warm, dry and intact. No rash noted. No erythema. No pallor.  Psychiatric: She has a normal mood and affect. Her speech is normal and behavior is normal. Her mood appears not anxious.  Nursing note and vitals reviewed.   ED Course  Procedures   Medications  ibuprofen (ADVIL,MOTRIN) tablet 600 mg (600 mg Oral Given 08/24/15 2336)  cyclobenzaprine (FLEXERIL) tablet 5 mg (5 mg Oral Given 08/24/15 2336)    DIAGNOSTIC STUDIES: Oxygen Saturation is 100% on RA, normal by my interpretation.    COORDINATION OF CARE: 11:29 PM -  Discussed CT scan results and plans to wait on diagnostic studies. Pt advised of plan for treatment and pt agrees.  1:00 AM- Pt notes she is feeling better now. She notes she carried a heavy jug for a time 2 days before the pain began. Discussed normal lab results and plans to have pt come in next week for an [place]. Will refer pt to a gastroenterologist to further evaluate her abnormal biliary tree. Discussed probable musculoskeletal origin of the pain. Pt advised of plan for treatment and pt agrees.    Patient's pain does not fit her CT results. Pain appears to be musculoskeletal and is tender over the very lateral left abdomen and it started after she carried a heavy object. However she does have a abnormally dilated biliary tree concerning for a mass or obstruction at the ampulla. She was scheduled for an outpatient ultrasound of her gallbladder on Monday, February 13 and she was given the number to call for the gastroenterologist to follow  through for further evaluation. MRI is not available here until after February 13 because it is the weekend.  Labs Review Results for orders placed or performed during the hospital encounter of 08/24/15  CBC with Differential/Platelet  Result Value Ref Range   WBC 9.6 4.0 - 10.5 K/uL   RBC 3.88 3.87 - 5.11 MIL/uL   Hemoglobin 12.0 12.0 - 15.0 g/dL   HCT 16.1 09.6 - 04.5 %   MCV 92.8 78.0 - 100.0 fL   MCH 30.9 26.0 - 34.0 pg   MCHC 33.3 30.0 - 36.0 g/dL   RDW 40.9 81.1 - 91.4 %   Platelets 249 150 - 400 K/uL   Neutrophils Relative % 70 %   Neutro Abs 6.8 1.7 - 7.7 K/uL   Lymphocytes Relative 17 %   Lymphs Abs 1.6 0.7 - 4.0 K/uL   Monocytes Relative 9 %   Monocytes Absolute 0.8 0.1 - 1.0 K/uL   Eosinophils Relative 3 %   Eosinophils Absolute 0.3 0.0 - 0.7 K/uL   Basophils Relative 1 %   Basophils Absolute 0.1 0.0 - 0.1 K/uL  Comprehensive metabolic panel  Result Value Ref Range   Sodium 138 135 - 145 mmol/L   Potassium 4.2 3.5 - 5.1 mmol/L    Chloride 104 101 - 111 mmol/L   CO2 26 22 - 32 mmol/L   Glucose, Bld 99 65 - 99 mg/dL   BUN 15 6 - 20 mg/dL   Creatinine, Ser 7.82 0.44 - 1.00 mg/dL   Calcium 9.1 8.9 - 95.6 mg/dL   Total Protein 7.1 6.5 - 8.1 g/dL   Albumin 3.9 3.5 - 5.0 g/dL   AST 17 15 - 41 U/L   ALT 12 (L) 14 - 54 U/L   Alkaline Phosphatase 67 38 - 126 U/L   Total Bilirubin 0.5 0.3 - 1.2 mg/dL   GFR calc non Af Amer 57 (L) >60 mL/min   GFR calc Af Amer >60 >60 mL/min   Anion gap 8 5 - 15  Lipase, blood  Result Value Ref Range   Lipase 33 11 - 51 U/L  Urinalysis, Routine w reflex microscopic (not at [facility])  Result Value Ref Range   Color, Urine YELLOW YELLOW   APPearance CLEAR CLEAR   Specific Gravity, Urine <1.005 (L) 1.005 - 1.030   pH 5.5 5.0 - 8.0   Glucose, UA NEGATIVE NEGATIVE mg/dL   Hgb urine dipstick SMALL (A) NEGATIVE   Bilirubin Urine NEGATIVE NEGATIVE   Ketones, ur NEGATIVE NEGATIVE mg/dL   Protein, ur NEGATIVE NEGATIVE mg/dL   Nitrite NEGATIVE NEGATIVE   Leukocytes, UA NEGATIVE NEGATIVE  Urine microscopic-add on  Result Value Ref Range   Squamous Epithelial / LPF 0-5 (A) NONE SEEN   WBC, UA 0-5 0 - 5 WBC/hpf   RBC / HPF 0-5 0 - 5 RBC/hpf   Bacteria, UA RARE (A) NONE SEEN   Laboratory interpretation all normal   Imaging Review Ct Abdomen Pelvis W Contrast  08/24/2015  CLINICAL DATA:  Left upper quadrant left lower quadrant pain and low back pain for 2 weeks. History of tubal ligation. EXAM: CT ABDOMEN AND PELVIS WITH CONTRAST TECHNIQUE: Multidetector CT imaging of the abdomen and pelvis was performed using the standard protocol following bolus administration of intravenous contrast. CONTRAST:  OMNIPAQUE IOHEXOL 300 MG/ML  SOLN COMPARISON:  Renal ultrasound from the same date. FINDINGS: Lower chest:  No acute findings. Hepatobiliary: No masses or other significant abnormality. The gallbladder is collapsed. Extrahepatic common  bile duct measures up to 10 mm. Pancreas: The pancreas is  atrophic. There is diffuse dilation of the main pancreatic duct, measuring 2 mm in the body of the pancreas. Spleen: The spleen is small. Adrenals/Urinary Tract: No masses identified. No evidence of hydronephrosis. Stomach/Bowel: No evidence of obstruction, inflammatory process, or abnormal fluid collections. Vascular/Lymphatic: No pathologically enlarged lymph nodes. No evidence of abdominal aortic aneurysm. Tortuosity and heavy calcified atherosclerotic disease of the aorta and common iliac arteries are seen. Bulky atherosclerotic calcifications are seen at the takeoff of the main abdominal arterial branches. The portal vein, splenic vein, superior mesenteric vein are patent. SMA, celiac trunk, IMA, bilateral renal arteries are patent. Reproductive: No mass or other significant abnormality. Other: Small amount of simple free fluid in the pelvis. Musculoskeletal: No suspicious bone lesions identified. Small spleen. Heavy atherosclerotic disease of the aorta and its main branches. 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 Further evaluation with ERCP or MRCP is recommended to exclude obstruction/ mass at the level of the ampulla. Small spleen. Heavy calcified atherosclerotic disease of the aorta and its main branches. Small amount of free fluid in the pelvis, which is abnormal for patient's age. Electronically Signed   By: Ted Mcalpine M.D.   On: 08/24/2015 18:41   US Renal  08/24/2015  CLINICAL DATA:  Left flank pain and left lower quadrant pain. EXAM: RENAL / URINARY TRACT ULTRASOUND COMPLETE COMPARISON:  None. FINDINGS: Right Kidney: Length: 9.1 cm. Echogenicity within normal limits. No mass or hydronephrosis visualized. Left Kidney: Length: 9.1 cm. Echogenicity within normal limits. No mass or hydronephrosis visualized. Bladder: Appears normal for degree of bladder distention. Bilateral ureteral jets are seen.  IMPRESSION: Normal renal ultrasound. Electronically Signed   By: Ted Mcalpine M.D.   On: 08/24/2015 13:33   Dg Ribs Unilateral W/chest Left  08/14/2015  CLINICAL DATA:  Left rib pain since Sunday EXAM: LEFT RIBS AND CHEST - 3+ VIEW COMPARISON:  05/09/2015 FINDINGS: No fracture or other bone lesions are seen involving the ribs. There is no evidence of pneumothorax or pleural effusion. Both lungs are clear. Heart size and mediastinal contours are within normal limits. Aortic atherosclerosis noted. IMPRESSION: 1. No acute findings. 2. Aortic atherosclerosis noted. Electronically Signed   By: Signa Kell M.D.   On: 08/14/2015 18:04    I have personally reviewed and evaluated these images and lab results as part of my medical decision-making.   EKG Interpretation None      MDM   Final diagnoses:  None    Discharge Medication List as of 08/25/2015  1:34 AM    START taking these medications   Details  cyclobenzaprine (FLEXERIL) 5 MG tablet Take 1 tablet (5 mg total) by mouth 3 (three) times daily as needed., Starting 08/25/2015, Until Discontinued, Print    naproxen (NAPROSYN) 250 MG tablet Take 1 po BID with food prn pain, Print        Plan discharge  Devoria Albe, MD, FACEP  I personally performed the services described in this documentation, which was scribed in my presence. The recorded information has been reviewed and considered.  Devoria Albe, MD, Concha Pyo, MD 08/25/15 [phone]

## 2015-08-25 MED ORDER — CYCLOBENZAPRINE HCL 5 MG PO TABS: 5.0000 mg | ORAL_TABLET | Freq: Three times a day (TID) | ORAL | Status: DC | PRN

## 2015-08-25 MED ORDER — NAPROXEN 250 MG PO TABS: ORAL_TABLET | ORAL | Status: DC

## 2015-08-25 NOTE — Discharge Instructions (Signed)
Use ice and heat on the painful areas.  Take the medications for pain. You have an appointment on Monday, February 13th at 11 am. Arrive in Radiology 15 minutes early to sign in. Do not eat or drink after 4 am until after your test. Call Dr Evelina Dun office to get an appointment later in the week to discuss your abnormal dilated ducts in your gallbladder and pancreas.  Return to the ED if you get a fever, right sided abdominal pain, vomiting, coughing.

## 2015-08-27 ENCOUNTER — Other Ambulatory Visit (HOSPITAL_COMMUNITY): Payer: Medicare HMO

## 2015-08-27 ENCOUNTER — Ambulatory Visit (HOSPITAL_COMMUNITY)
Admission: RE | Admit: 2015-08-27 | Discharge: 2015-08-27 | Disposition: A | Discharge status: Home / Self Care | Payer: Medicare HMO | Source: Ambulatory Visit | Attending: Emergency Medicine | Admitting: Emergency Medicine

## 2015-08-27 DIAGNOSIS — K838 Other specified diseases of biliary tract: Secondary | ICD-10-CM | POA: Insufficient documentation

## 2015-08-27 NOTE — ED Provider Notes (Signed)
11:45- he returned today for the schedule outpatient ultrasound of the abdomen. Ultrasound is reassuring, there is no biliary rotation, as was seen on the CT scan which was done on 08/24/15. She continues to have left-sided abdominal pain but takes a pain medicine for it. I discussed the findings with the patient and recommended that she follow-up with her PCP. She was in agreement with that plan.  Mancel Bale, MD 08/27/15 [phone]

## 2015-08-29 ENCOUNTER — Other Ambulatory Visit: Payer: Self-pay

## 2015-08-29 ENCOUNTER — Ambulatory Visit (INDEPENDENT_AMBULATORY_CARE_PROVIDER_SITE_OTHER): Payer: Medicare HMO | Admitting: Gastroenterology

## 2015-08-29 ENCOUNTER — Encounter: Payer: Self-pay | Admitting: Gastroenterology

## 2015-08-29 ENCOUNTER — Ambulatory Visit (HOSPITAL_COMMUNITY)
Admission: RE | Admit: 2015-08-29 | Discharge: 2015-08-29 | Disposition: A | Discharge status: Home / Self Care | Payer: Medicare HMO | Source: Ambulatory Visit | Attending: Gastroenterology | Admitting: Gastroenterology

## 2015-08-29 VITALS — BP 125/82 | HR 79 | Temp 96.7°F | Ht 62.0 in | Wt 88.0 lb

## 2015-08-29 DIAGNOSIS — R1012 Left upper quadrant pain: Secondary | ICD-10-CM

## 2015-08-29 DIAGNOSIS — R1013 Epigastric pain: Secondary | ICD-10-CM

## 2015-08-29 DIAGNOSIS — R109 Unspecified abdominal pain: Secondary | ICD-10-CM

## 2015-08-29 DIAGNOSIS — Z1211 Encounter for screening for malignant neoplasm of colon: Secondary | ICD-10-CM

## 2015-08-29 MED ORDER — NA SULFATE-K SULFATE-MG SULF 17.5-3.13-1.6 GM/177ML PO SOLN: 1.0000 | ORAL | Status: DC

## 2015-08-29 MED ORDER — OMEPRAZOLE 20 MG PO CPDR: 20.0000 mg | DELAYED_RELEASE_CAPSULE | Freq: Every day | ORAL | Status: DC

## 2015-08-29 NOTE — Progress Notes (Unsigned)
Had to change her form the 09/17/15 to 09/26/15. She is aware and I am sending her new instructions in the mail.

## 2015-08-29 NOTE — Assessment & Plan Note (Addendum)
PERSISTS FOR 3 WEEKS AND NOT BETTER. IMAGING SHOWS NO ACUTE DISEASE ON LEFT SIDE. DIFFERENTIAL DIAGNOSIS INCLUDES: REFERRED PAIN FROM DISC DISEASE, H PYLORI GASTRITIS, UNCONTROLLED GERD,  LESS LIKELY GE JUNCTION TUMOR, GASTRIC OR PANCREATIC CA OR CHRONIC MESENTERIC ISCHEMIA.  ATTEMPTED TO REVIEW CT AND IMAGING WITH RADIOLOGY BT NOT AVAILABLE. EGD/TCS IN 2-3 WEEKS. DISCUSSED PROCEDURE, BENEFITS, & RISKS: < 1% chance of medication reaction, bleeding, perforation, or rupture of spleen/liver. NEEDS PEDS SCOPE. CONTINUE NAPROXEN AND VALIUM/FLEXERIL PRN. D/C BACTRIM. COMPLETE PLAIN FILMS/CXR. Use Prilosec 30 minutes prior to your first meal. FOLLOW UP IN 2 MOS.    Aug 16: PT NO SHOW FOR EGD AND MISSED O[facility]. I PERSONALLY REVIEWED THE CT WITH DR. Tyron Russell. EXTRAHEPATIC DUCT 8 MM, BUT NOT LIKELY CLINICALLY SIGNIFICANT IN SETTING OF NORMAL LIVER ENZYMES. WILL CONTACT PT FOR FOLLOW UP.

## 2015-08-29 NOTE — Patient Instructions (Addendum)
  Use Prilosec 30 minutes prior to your first meal.  Complete xrays.  COMPLETE ENDOSCOPY IN 2-3 WEEKS.  FOLLOW UP IN 2 MOS.

## 2015-08-29 NOTE — Progress Notes (Signed)
ON RECALL  °

## 2015-08-29 NOTE — Progress Notes (Signed)
No PA was needed for there TCS/EGD

## 2015-08-29 NOTE — Progress Notes (Signed)
Subjective:    Patient ID: Rita Yang, female    DOB: Mar 08, 1951, 65 y.o.   MRN: [id number]  Rita Obey, MD  HPI 3 weeks ago woke with pain on flank. When she presses there it doesn't hurt. In morning sharp and if she bends over the wrong way it makes it worse. It takes pain pills and it goes away.  No change in appetite. Weight loss: no-been 88 lbs a year or more. He had a stroke and thEn cancer. STILL CARING FOR HIM. NOW DOESN'T LIFTED OR PULL ON HIM. BETTER FOR PAST 3 MOS. BMs: AFTER PAIN 2-3 TIMES/WEEK. BEFORE PAIN MEDS QD-QOD. L SIDE PAIN DOESN'T GET WORSE WITH FOOD. NO FALLS OR MVA. 5 YEARS AGO SHE WEIGHED 105 LBS. CIGS: 1/2 PK & IT USED TO BE LESS. IN HER 40/50S ~1 PK A DAY. NO TCS OR EGD EVER. BC POWDERS: 1/2 PK 2-3X/WEEK.  PT DENIES FEVER, CHILLS, HEMATOCHEZIA, HEMATEMESIS, nausea, vomiting, melena, diarrhea, CHEST PAIN, SHORTNESS OF BREATH,  problems swallowing, problems with sedation, OR heartburn or indigestion.  Past Medical History  Diagnosis Date  . Anxiety   . Bronchitis    Past Surgical History  Procedure Laterality Date  . Hand surgery     Allergies  Allergen Reactions  . Other     Flu shot    Current Outpatient Prescriptions  Medication Sig Dispense Refill  . TYLENOL 325 MG tablet Take 650 mg by mouth every 6 (six) hours as needed for mild pain or moderate pain.    Marland Kitchen albuterol  MCG/ACT inhaler Inhale 1-2 puffs into the lungs Q6H PRN for wheezing or shortness of breath.    . Aspirin-Salicylamide-Caffeine  Take 1 packet by mouth daily as needed (for pain). NONE IN PAST 3 WKS   . cyclobenzaprine  5 MG tablet Take 1 tablet (5 mg total) by mouth 3 (three) times daily as needed.    . diazepam (VALIUM) 5 MG tablet Take 1 tablet by mouth 3 (three) times daily.    Marland Kitchen ibuprofen 800 MG tablet Take 1 tablet (800 mg total) by mouth 3 (three) times daily.    . naproxen (NAPROSYN) 250 MG tablet Take 1 po BID with food prn pain    . BACTRIM DS,SEPTRA DS tablet Take 1  tablet by mouth daily. Course started on 08/22/15    .      Marland Kitchen        Family History  Problem Relation Age of Onset  . Colon cancer Neg Hx   . Colon polyps Neg Hx    Social History  Substance Use Topics  . Smoking status: Current Every Day Smoker -- 0.50 packs/day    Types: Cigarettes  . Smokeless tobacco: None  . Alcohol Use: No   Review of Systems PER HPI OTHERWISE ALL SYSTEMS ARE NEGATIVE.    Objective:   Physical Exam  Constitutional: She is oriented to person, place, and time. She appears well-developed and well-nourished. No distress.  HENT:  Head: Normocephalic and atraumatic.  Mouth/Throat: Oropharynx is clear and moist. No oropharyngeal exudate.  Eyes: Pupils are equal, round, and reactive to light. No scleral icterus.  Neck: Normal range of motion. Neck supple.  Cardiovascular: Normal rate, regular rhythm and normal heart sounds.   Pulmonary/Chest: Effort normal and breath sounds normal. No respiratory distress.  Abdominal: Soft. Bowel sounds are normal. She exhibits no distension. There is no tenderness.  Negative Carnett's sign  Musculoskeletal: She exhibits no edema.  Lymphadenopathy:  She has no cervical adenopathy.  Neurological: She is alert and oriented to person, place, and time.  NO FOCAL DEFICITS  Psychiatric: She has a normal mood and affect.  Vitals reviewed.     Assessment & Plan:

## 2015-08-30 NOTE — Progress Notes (Signed)
cc'ed to pcp °

## 2015-09-03 ENCOUNTER — Ambulatory Visit (HOSPITAL_COMMUNITY)
Admission: RE | Admit: 2015-09-03 | Discharge: 2015-09-03 | Disposition: A | Discharge status: Home / Self Care | Payer: Medicare HMO | Source: Ambulatory Visit | Attending: Gastroenterology | Admitting: Gastroenterology

## 2015-09-03 DIAGNOSIS — R05 Cough: Secondary | ICD-10-CM | POA: Insufficient documentation

## 2015-09-03 DIAGNOSIS — R109 Unspecified abdominal pain: Secondary | ICD-10-CM

## 2015-09-03 DIAGNOSIS — J449 Chronic obstructive pulmonary disease, unspecified: Secondary | ICD-10-CM | POA: Insufficient documentation

## 2015-09-03 DIAGNOSIS — F17200 Nicotine dependence, unspecified, uncomplicated: Secondary | ICD-10-CM | POA: Insufficient documentation

## 2015-09-03 DIAGNOSIS — R1012 Left upper quadrant pain: Secondary | ICD-10-CM | POA: Diagnosis present

## 2015-09-03 DIAGNOSIS — R079 Chest pain, unspecified: Secondary | ICD-10-CM | POA: Insufficient documentation

## 2015-09-06 ENCOUNTER — Telehealth: Payer: Self-pay | Admitting: Gastroenterology

## 2015-09-06 NOTE — Telephone Encounter (Signed)
PLEASE CALL PT. HER SPINE XRAY SHOWS MILD DEGENERATIVE DISEASE AND HER CHEST XRAY SHOWS EMPHYSEMA/COPD.

## 2015-09-07 ENCOUNTER — Telehealth: Payer: Self-pay | Admitting: General Practice

## 2015-09-07 NOTE — Telephone Encounter (Signed)
LMOM to call.

## 2015-09-07 NOTE — Telephone Encounter (Signed)
Patient called in wanting to know what medicine she can take for a cold that does not have Aspirin in it.  She said they you gave her a name of a medicine she could take, however she can't remember it.

## 2015-09-08 ENCOUNTER — Emergency Department (HOSPITAL_COMMUNITY): Payer: Medicare HMO

## 2015-09-08 ENCOUNTER — Emergency Department (HOSPITAL_COMMUNITY)
Admission: EM | Admit: 2015-09-08 | Discharge: 2015-09-08 | Disposition: A | Discharge status: Home / Self Care | Payer: Medicare HMO | Attending: Emergency Medicine | Admitting: Emergency Medicine

## 2015-09-08 ENCOUNTER — Encounter (HOSPITAL_COMMUNITY): Payer: Self-pay | Admitting: Emergency Medicine

## 2015-09-08 DIAGNOSIS — R05 Cough: Secondary | ICD-10-CM | POA: Diagnosis present

## 2015-09-08 DIAGNOSIS — Z79899 Other long term (current) drug therapy: Secondary | ICD-10-CM | POA: Diagnosis not present

## 2015-09-08 DIAGNOSIS — J42 Unspecified chronic bronchitis: Secondary | ICD-10-CM | POA: Diagnosis not present

## 2015-09-08 DIAGNOSIS — J189 Pneumonia, unspecified organism: Secondary | ICD-10-CM | POA: Insufficient documentation

## 2015-09-08 DIAGNOSIS — F17210 Nicotine dependence, cigarettes, uncomplicated: Secondary | ICD-10-CM | POA: Insufficient documentation

## 2015-09-08 LAB — CBC WITH DIFFERENTIAL/PLATELET
BASOS ABS: 0 10*3/uL (ref 0.0–0.1)
BASOS PCT: 0 %
Eosinophils Absolute: 0 10*3/uL (ref 0.0–0.7)
Eosinophils Relative: 0 %
HEMATOCRIT: 31.6 % — AB (ref 36.0–46.0)
Hemoglobin: 10.7 g/dL — ABNORMAL LOW (ref 12.0–15.0)
LYMPHS PCT: 9 %
Lymphs Abs: 0.8 10*3/uL (ref 0.7–4.0)
MCH: 30.9 pg (ref 26.0–34.0)
MCHC: 33.9 g/dL (ref 30.0–36.0)
MCV: 91.3 fL (ref 78.0–100.0)
MONO ABS: 0.7 10*3/uL (ref 0.1–1.0)
Monocytes Relative: 9 %
NEUTROS ABS: 6.8 10*3/uL (ref 1.7–7.7)
Neutrophils Relative %: 82 %
Platelets: 141 10*3/uL — ABNORMAL LOW (ref 150–400)
RBC: 3.46 MIL/uL — AB (ref 3.87–5.11)
RDW: 13.8 % (ref 11.5–15.5)
WBC: 8.3 10*3/uL (ref 4.0–10.5)

## 2015-09-08 LAB — BASIC METABOLIC PANEL
Anion gap: 11 (ref 5–15)
BUN: 27 mg/dL — ABNORMAL HIGH (ref 6–20)
CALCIUM: 8 mg/dL — AB (ref 8.9–10.3)
CO2: 24 mmol/L (ref 22–32)
CREATININE: 1.49 mg/dL — AB (ref 0.44–1.00)
Chloride: 100 mmol/L — ABNORMAL LOW (ref 101–111)
GFR calc non Af Amer: 35 mL/min — ABNORMAL LOW (ref >60)
GFR, EST AFRICAN AMERICAN: 41 mL/min — AB (ref >60)
GLUCOSE: 124 mg/dL — AB (ref 65–99)
Potassium: 3.6 mmol/L (ref 3.5–5.1)
Sodium: 135 mmol/L (ref 135–145)

## 2015-09-08 LAB — LACTIC ACID, PLASMA: Lactic Acid, Venous: 1.2 mmol/L (ref 0.5–2.0)

## 2015-09-08 MED ORDER — AZITHROMYCIN 250 MG PO TABS: ORAL_TABLET | ORAL | Status: DC

## 2015-09-08 MED ORDER — ALBUTEROL SULFATE HFA 108 (90 BASE) MCG/ACT IN AERS
2.0000 | INHALATION_SPRAY | RESPIRATORY_TRACT | Status: DC | PRN
  Filled 2015-09-08: qty 6.7

## 2015-09-08 MED ORDER — DEXTROSE 5 % IV SOLN
500.0000 mg | Freq: Once | INTRAVENOUS | Status: AC
  Administered 2015-09-08: 500 mg via INTRAVENOUS
  Filled 2015-09-08: qty 500

## 2015-09-08 MED ORDER — DEXTROSE 5 % IV SOLN
1.0000 g | Freq: Once | INTRAVENOUS | Status: AC
  Administered 2015-09-08: 1 g via INTRAVENOUS
  Filled 2015-09-08: qty 10

## 2015-09-08 NOTE — ED Notes (Signed)
Pt states that he chronic bronchitis has been "acting up" for the last two days.  C/o cough, occasional SOB, and fever

## 2015-09-08 NOTE — ED Notes (Signed)
Pt ambulated with assistance. O2 sats remained 97-98 %. No SOB

## 2015-09-08 NOTE — Discharge Instructions (Signed)

## 2015-09-10 NOTE — Telephone Encounter (Signed)
Pt is aware of results. 

## 2015-09-10 NOTE — ED Provider Notes (Signed)
CSN: [id number]     Arrival date & time 09/08/15  1940 History   First MD Initiated Contact with Patient 09/08/15 2014     Chief Complaint  Patient presents with  . Cough     (Consider location/radiation/quality/duration/timing/severity/associated sxs/prior Treatment) The history is provided by the patient and a relative.   Rita Yang is a 65 y.o. female who is a current smoker with known chronic bronchitis who presents at her families insistence secondary to increased coughing and shortness of breath with exertion. She was on the phone with her son tonight who noticed her breathing sounded congested and brought her in.  She does endorse having to use her albuterol mdi more frequently for the past week due to increased wheezing but denies sob.  She also denies chest pain, fevers or chills. Her cough has been productive of an occasional clear sputum. She denies abdominal pain, nausea, vomiting, chest pain, peripheral edema.        Past Medical History  Diagnosis Date  . Anxiety   . Bronchitis    Past Surgical History  Procedure Laterality Date  . Hand surgery     Family History  Problem Relation Age of Onset  . Colon cancer Neg Hx   . Colon polyps Neg Hx    Social History  Substance Use Topics  . Smoking status: Current Every Day Smoker -- 0.50 packs/day    Types: Cigarettes  . Smokeless tobacco: None  . Alcohol Use: No   OB History    No data available     Review of Systems  Constitutional: Negative for fever and chills.  HENT: Negative for congestion and sore throat.   Eyes: Negative.   Respiratory: Positive for cough and wheezing. Negative for chest tightness and shortness of breath.   Cardiovascular: Negative for chest pain, palpitations and leg swelling.  Gastrointestinal: Negative for nausea and abdominal pain.  Genitourinary: Negative.   Musculoskeletal: Negative for joint swelling, arthralgias and neck pain.  Skin: Negative.  Negative for rash and wound.   Neurological: Negative for dizziness, weakness, light-headedness, numbness and headaches.  Psychiatric/Behavioral: Negative.       Allergies  Other  Home Medications   Prior to Admission medications   Medication Sig Start Date End Date Taking? Authorizing Provider  acetaminophen (TYLENOL) 500 MG tablet Take 1,000 mg by mouth every 6 (six) hours as needed for mild pain.   Yes Historical Provider, MD  albuterol (PROVENTIL HFA;VENTOLIN HFA) 108 (90 Base) MCG/ACT inhaler Inhale 1-2 puffs into the lungs every 6 (six) hours as needed for wheezing or shortness of breath.   Yes Historical Provider, MD  cyclobenzaprine (FLEXERIL) 5 MG tablet Take 1 tablet (5 mg total) by mouth 3 (three) times daily as needed. 08/25/15  Yes Rolland Porter, MD  diazepam (VALIUM) 5 MG tablet Take 1 tablet by mouth every 8 (eight) hours as needed.  05/01/15  Yes Historical Provider, MD  naproxen (NAPROSYN) 250 MG tablet Take 1 po BID with food prn pain Patient taking differently: Take 250 mg by mouth 2 (two) times daily with a meal.  08/25/15  Yes Rolland Porter, MD  omeprazole (PRILOSEC) 20 MG capsule Take 1 capsule (20 mg total) by mouth daily. 08/29/15  Yes Danie Binder, MD  azithromycin (ZITHROMAX) 250 MG tablet Take 2 tablets by mouth on day one followed by one tablet daily for 4 days. 09/08/15   Evalee Jefferson, PA-C  cephALEXin (KEFLEX) 500 MG capsule Take 1 capsule (500 mg total) by  mouth 4 (four) times daily. Patient not taking: Reported on 08/24/2015 08/18/15   Daleen Bo, MD  ibuprofen (ADVIL,MOTRIN) 800 MG tablet Take 1 tablet (800 mg total) by mouth 3 (three) times daily. 08/14/15   Noemi Chapel, MD  Na Sulfate-K Sulfate-Mg Sulf (SUPREP BOWEL PREP) SOLN Take 1 kit by mouth as directed. 08/29/15   Danie Binder, MD   BP 115/74 mmHg  Pulse 82  Temp(Src) 98.2 F (36.8 C) (Oral)  Resp 20  SpO2 99% Physical Exam  Constitutional: She appears well-developed and well-nourished.  Cachectic female, no apparent distress.   HENT:  Head: Normocephalic and atraumatic.  Eyes: Conjunctivae are normal.  Neck: Normal range of motion.  Cardiovascular: Normal rate, regular rhythm, normal heart sounds and intact distal pulses.   Pulmonary/Chest: Effort normal. She has no decreased breath sounds. She has no wheezes. She has rhonchi in the right lower field. She has no rales.  Abdominal: Soft. Bowel sounds are normal. She exhibits no distension. There is no tenderness. There is no guarding.  Musculoskeletal: Normal range of motion. She exhibits no edema or tenderness.  Neurological: She is alert.  Skin: Skin is warm and dry.  Psychiatric: She has a normal mood and affect.  Nursing note and vitals reviewed.   ED Course  Procedures (including critical care time) Labs Review Labs Reviewed  CBC WITH DIFFERENTIAL/PLATELET - Abnormal; Notable for the following:    RBC 3.46 (*)    Hemoglobin 10.7 (*)    HCT 31.6 (*)    Platelets 141 (*)    All other components within normal limits  BASIC METABOLIC PANEL - Abnormal; Notable for the following:    Chloride 100 (*)    Glucose, Bld 124 (*)    BUN 27 (*)    Creatinine, Ser 1.49 (*)    Calcium 8.0 (*)    GFR calc non Af Amer 35 (*)    GFR calc Af Amer 41 (*)    All other components within normal limits  LACTIC ACID, PLASMA    Imaging Review Dg Chest 2 View  09/08/2015  CLINICAL DATA:  Productive cough with shortness of breath and fevers EXAM: CHEST  2 VIEW COMPARISON:  09/03/2015 FINDINGS: Cardiac shadow is within normal limits. The lungs are again hyperinflated. Bibasilar infiltrates right greater than left are noted. No acute bony abnormality is noted. IMPRESSION: Bibasilar infiltrates Electronically Signed   By: Inez Catalina M.D.   On: 09/08/2015 20:42   I have personally reviewed and evaluated these images and lab results as part of my medical decision-making.   EKG Interpretation None      MDM   Final diagnoses:  Community acquired pneumonia    Pt was  given IV antibiotics including rocephin and zithromax to cover for CAP, prescribed additional zithromax. No wheezing while here, endorses she is almost out of her mdi, given inhaler.  She ambulated in dept without desaturation.  She appears stable for dc, but was advised close f/u with pcp in 2 days, returning here sooner for any worsened sx including weakness or sob.  Pt and family understand and agree with plan.   Discussed with Dr. Thurnell Garbe prior to dc home.    Evalee Jefferson, PA-C 09/10/15 [place], DO 09/11/15 1244

## 2015-09-11 NOTE — Telephone Encounter (Signed)
PLEASE CALL PT. SHE CAN USE TYLENOL AS NEEDED.

## 2015-09-11 NOTE — Telephone Encounter (Signed)
Patient made aware.

## 2015-09-11 NOTE — Telephone Encounter (Signed)
I tried to call patient, no answer, lmom

## 2015-09-24 ENCOUNTER — Telehealth: Payer: Self-pay

## 2015-09-24 NOTE — Telephone Encounter (Signed)
PT called to cancel her TCS because she has Pneumonia and is under the care of her PCP. She will call us back to reschedule

## 2015-09-25 NOTE — Telephone Encounter (Signed)
REVIEWED-NO ADDITIONAL RECOMMENDATIONS. 

## 2015-09-26 ENCOUNTER — Encounter (HOSPITAL_COMMUNITY): Admission: RE | Payer: Self-pay | Source: Ambulatory Visit

## 2015-09-26 ENCOUNTER — Ambulatory Visit (HOSPITAL_COMMUNITY): Admission: RE | Admit: 2015-09-26 | Payer: Medicare HMO | Source: Ambulatory Visit | Admitting: Gastroenterology

## 2015-09-26 SURGERY — EGD (ESOPHAGOGASTRODUODENOSCOPY)
Anesthesia: Moderate Sedation

## 2015-10-02 ENCOUNTER — Encounter: Payer: Self-pay | Admitting: Gastroenterology

## 2015-11-20 ENCOUNTER — Other Ambulatory Visit (HOSPITAL_COMMUNITY): Payer: Self-pay | Admitting: Family Medicine

## 2015-11-20 ENCOUNTER — Ambulatory Visit (HOSPITAL_COMMUNITY)
Admission: RE | Admit: 2015-11-20 | Discharge: 2015-11-20 | Disposition: A | Discharge status: Home / Self Care | Payer: Medicare HMO | Source: Ambulatory Visit | Attending: Family Medicine | Admitting: Family Medicine

## 2015-11-20 DIAGNOSIS — R5383 Other fatigue: Secondary | ICD-10-CM | POA: Insufficient documentation

## 2015-11-20 DIAGNOSIS — J449 Chronic obstructive pulmonary disease, unspecified: Secondary | ICD-10-CM | POA: Insufficient documentation

## 2015-11-20 DIAGNOSIS — F17210 Nicotine dependence, cigarettes, uncomplicated: Secondary | ICD-10-CM | POA: Diagnosis not present

## 2015-11-20 DIAGNOSIS — R5382 Chronic fatigue, unspecified: Secondary | ICD-10-CM

## 2016-02-27 ENCOUNTER — Telehealth: Payer: Self-pay | Admitting: Gastroenterology

## 2016-02-27 NOTE — Telephone Encounter (Signed)
PLEASE CALL PT TO [facility].

## 2016-02-28 ENCOUNTER — Encounter: Payer: Self-pay | Admitting: Gastroenterology

## 2016-02-28 NOTE — Telephone Encounter (Signed)
APPOINTMENT MADE, LEFT MESSAGE ON PATIENT ANSWERING MACHINE AND MAILED LETTER

## 2016-04-03 ENCOUNTER — Encounter: Payer: Self-pay | Admitting: Gastroenterology

## 2016-04-03 ENCOUNTER — Encounter (INDEPENDENT_AMBULATORY_CARE_PROVIDER_SITE_OTHER): Payer: Medicare HMO | Admitting: Gastroenterology

## 2016-04-03 NOTE — Progress Notes (Signed)
   Subjective:    Patient ID: Rita Yang, female    DOB: 10/26/1950, 65 y.o.   MRN: [id number]  Rita ObeyStephen D Knowlton, MD   HPI   Past Medical History:  Diagnosis Date  . Anxiety   . Bronchitis     Past Surgical History:  Procedure Laterality Date  . HAND SURGERY      Allergies  Allergen Reactions  . Other Other (See Comments)    Flu shot. "hot"    Review of Systems PER HPI OTHERWISE ALL SYSTEMS ARE NEGATIVE.     Objective:   Physical Exam        Assessment & Plan:

## 2016-05-08 ENCOUNTER — Telehealth: Payer: Self-pay | Admitting: Gastroenterology

## 2016-05-08 ENCOUNTER — Ambulatory Visit: Payer: Medicare HMO | Admitting: Gastroenterology

## 2016-05-08 ENCOUNTER — Encounter: Payer: Self-pay | Admitting: Gastroenterology

## 2016-05-08 ENCOUNTER — Encounter: Payer: Self-pay | Admitting: General Practice

## 2016-05-08 NOTE — Telephone Encounter (Signed)
Pt was a no show and letter sent  °

## 2016-05-08 NOTE — Telephone Encounter (Signed)
REVIEWED-NO ADDITIONAL RECOMMENDATIONS. 

## 2016-05-08 NOTE — Telephone Encounter (Signed)
PT CANCELLED TCS. NOS HOW FOR 2 APPTS. SEND LETTER TO DISCHARGE FROM THE PRACTICE.

## 2016-05-08 NOTE — Telephone Encounter (Signed)
Discharge letter mailed  

## 2016-05-08 NOTE — Progress Notes (Deleted)
   Subjective:    Patient ID: Rita Yang, female    DOB: 12/03/1950, 65 y.o.   MRN: [id number]  HPI    Review of Systems     Objective:   Physical Exam        Assessment & Plan:

## 2016-06-04 ENCOUNTER — Telehealth: Payer: Self-pay

## 2016-06-04 NOTE — Telephone Encounter (Signed)
pts discharge letter was returned- unclaimed. I have made a copy of the envelope and sent it to be scanned. I have re-mailed the letter to the pt via [employer].

## 2016-07-01 ENCOUNTER — Other Ambulatory Visit (HOSPITAL_COMMUNITY): Payer: Self-pay | Admitting: Family Medicine

## 2016-07-01 DIAGNOSIS — Z1231 Encounter for screening mammogram for malignant neoplasm of breast: Secondary | ICD-10-CM

## 2016-07-01 DIAGNOSIS — Z780 Asymptomatic menopausal state: Secondary | ICD-10-CM

## 2016-07-17 ENCOUNTER — Encounter (INDEPENDENT_AMBULATORY_CARE_PROVIDER_SITE_OTHER): Payer: Self-pay | Admitting: *Deleted

## 2016-07-24 ENCOUNTER — Ambulatory Visit (HOSPITAL_COMMUNITY): Payer: Medicare HMO

## 2016-07-24 ENCOUNTER — Other Ambulatory Visit (HOSPITAL_COMMUNITY): Payer: Medicare HMO

## 2016-11-02 IMAGING — DX DG THORACIC SPINE 2V
3 series · 3 of 3 positions shown · non-contrast
Comparison: 08/14/2015

CLINICAL DATA: Left thoracic pain, no known injury

EXAM:
THORACIC SPINE 2 VIEWS

[t-spine ap]
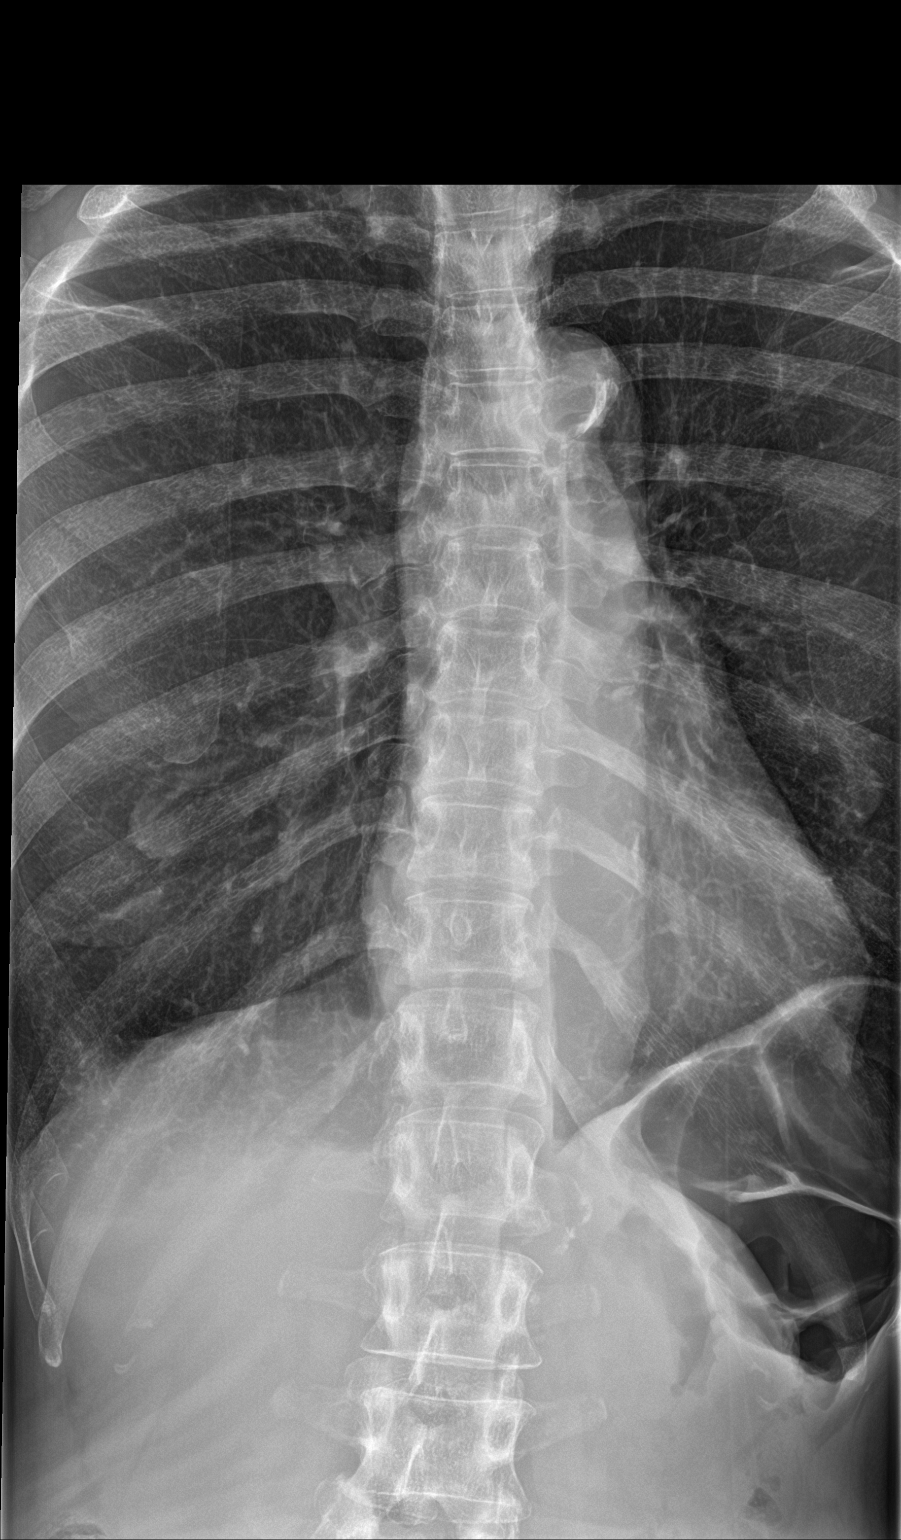

[t-spine lat]
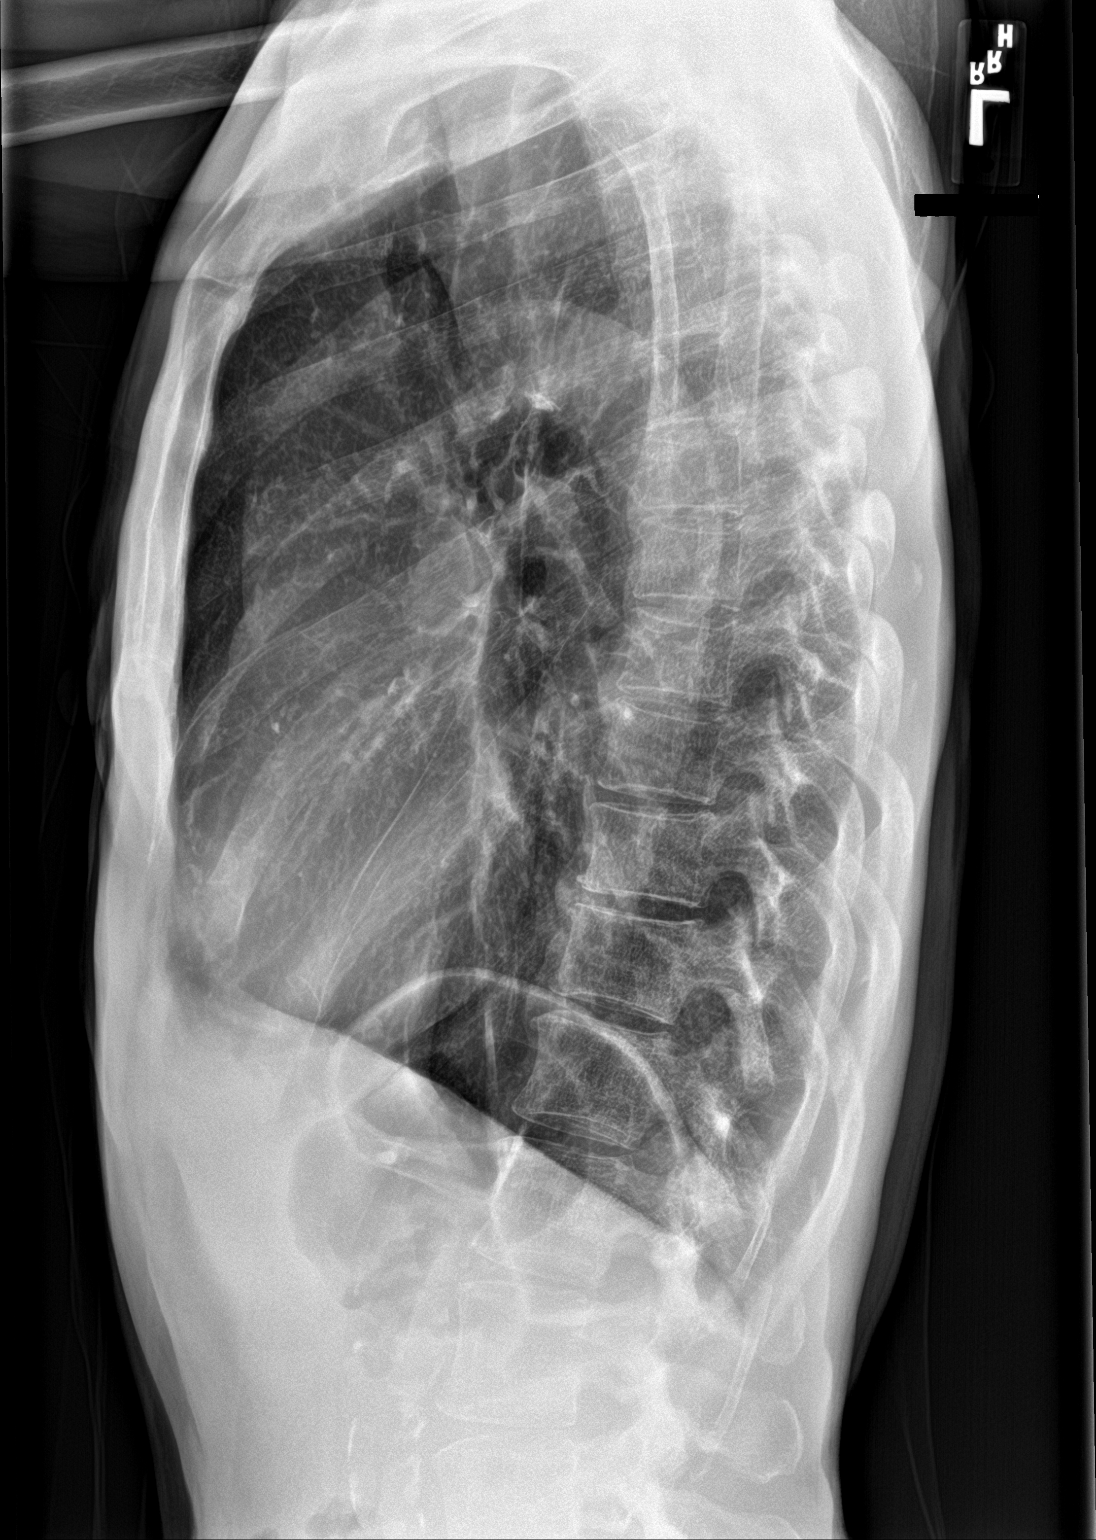

[t-spine swimmers]
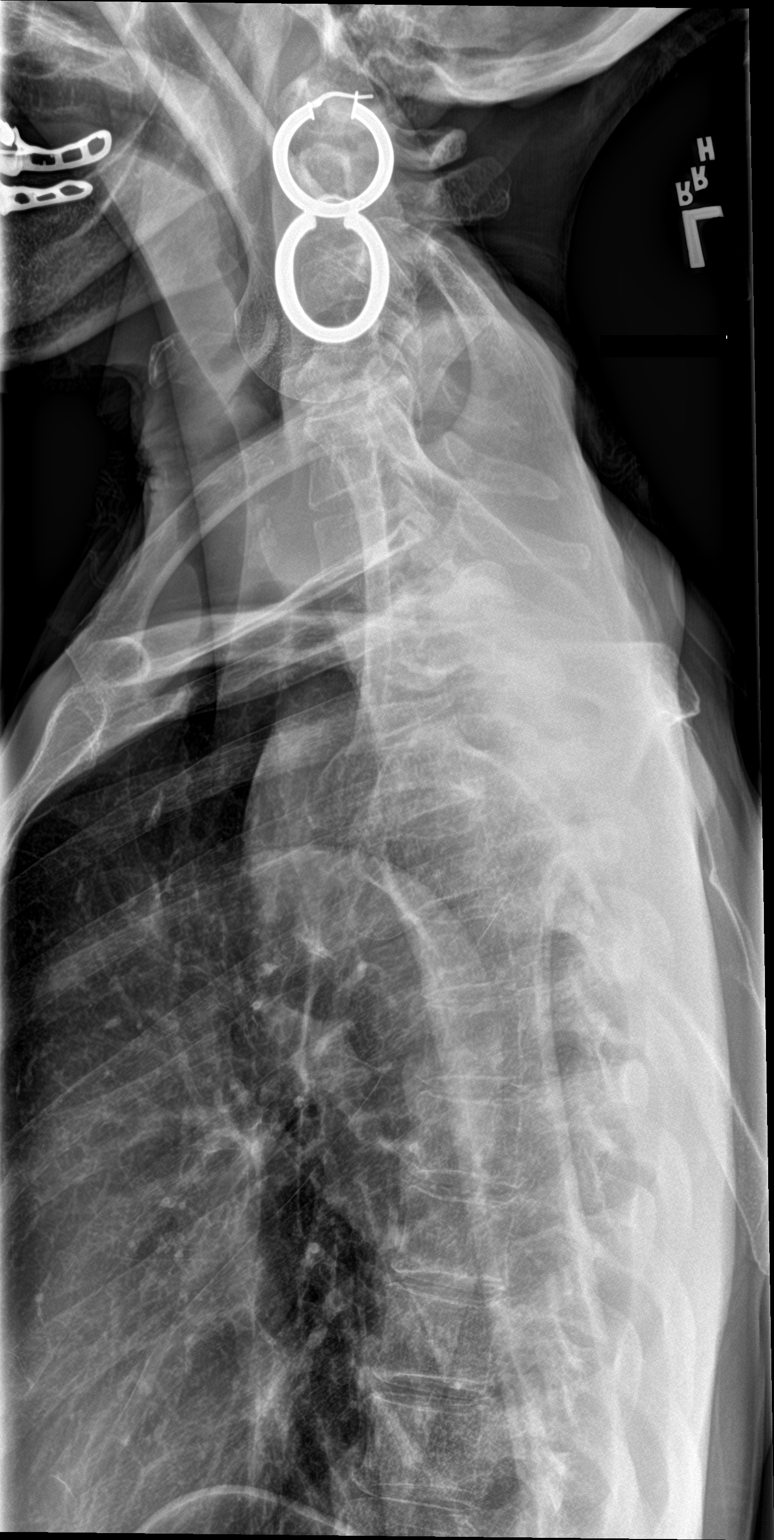

[3 of 3 positions shown; findings below may reference images not displayed]

FINDINGS: Three views of thoracic spine submitted. No acute fracture or
subluxation. Atherosclerotic calcifications of thoracic aorta again
noted. Alignment and vertebral body heights are preserved. Minimal
degenerative changes with anterior spurring lower thoracic spine.
IMPRESSION: No acute fracture or subluxation. Minimal degenerative changes lower
thoracic spine.

## 2016-11-07 IMAGING — DX DG CHEST 2V
2 series · 2 of 2 positions shown · non-contrast
Comparison: 08/14/2015

CLINICAL DATA: Left chest pain for 3 weeks.  Cough, smoker.

EXAM:
CHEST  2 VIEW

[chest pa]
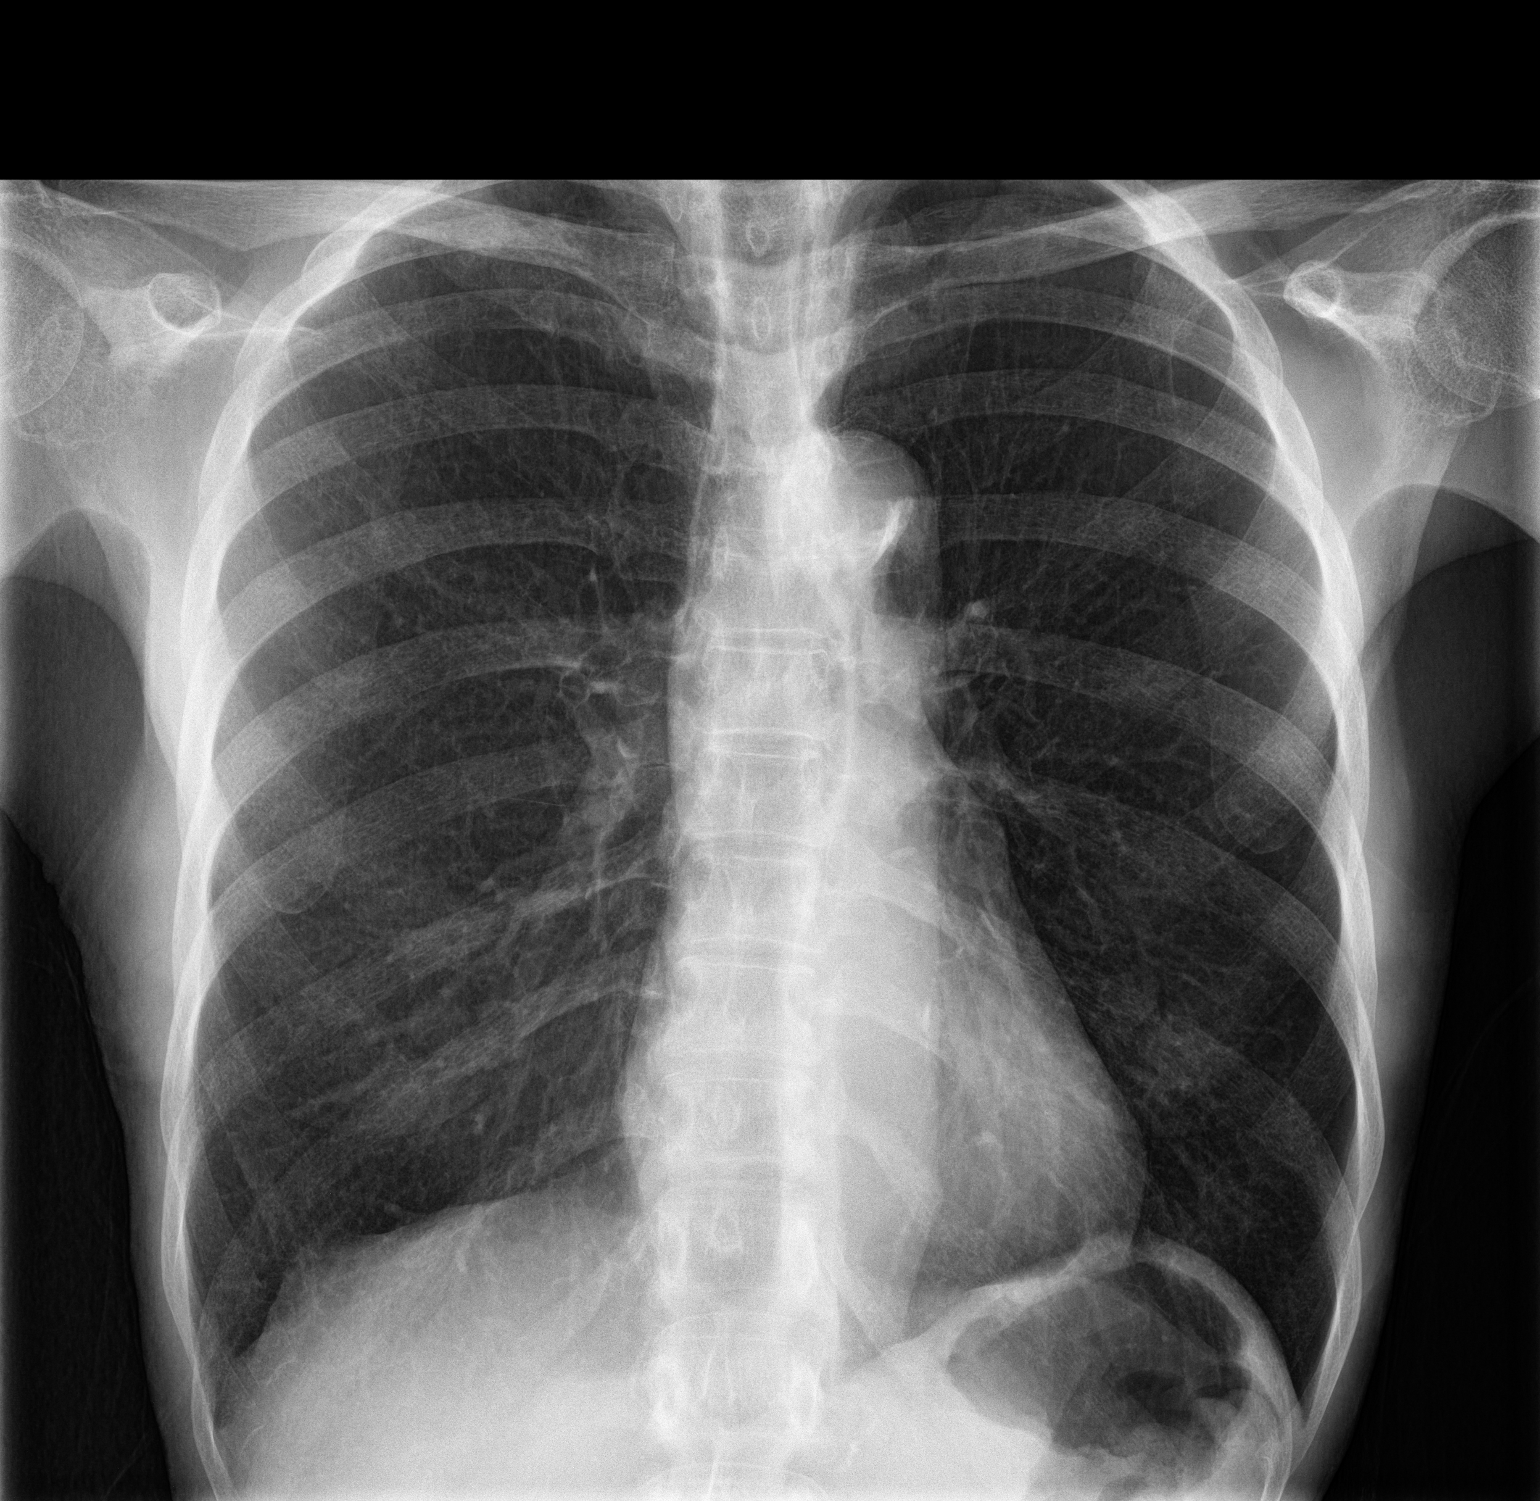

[chest lat]
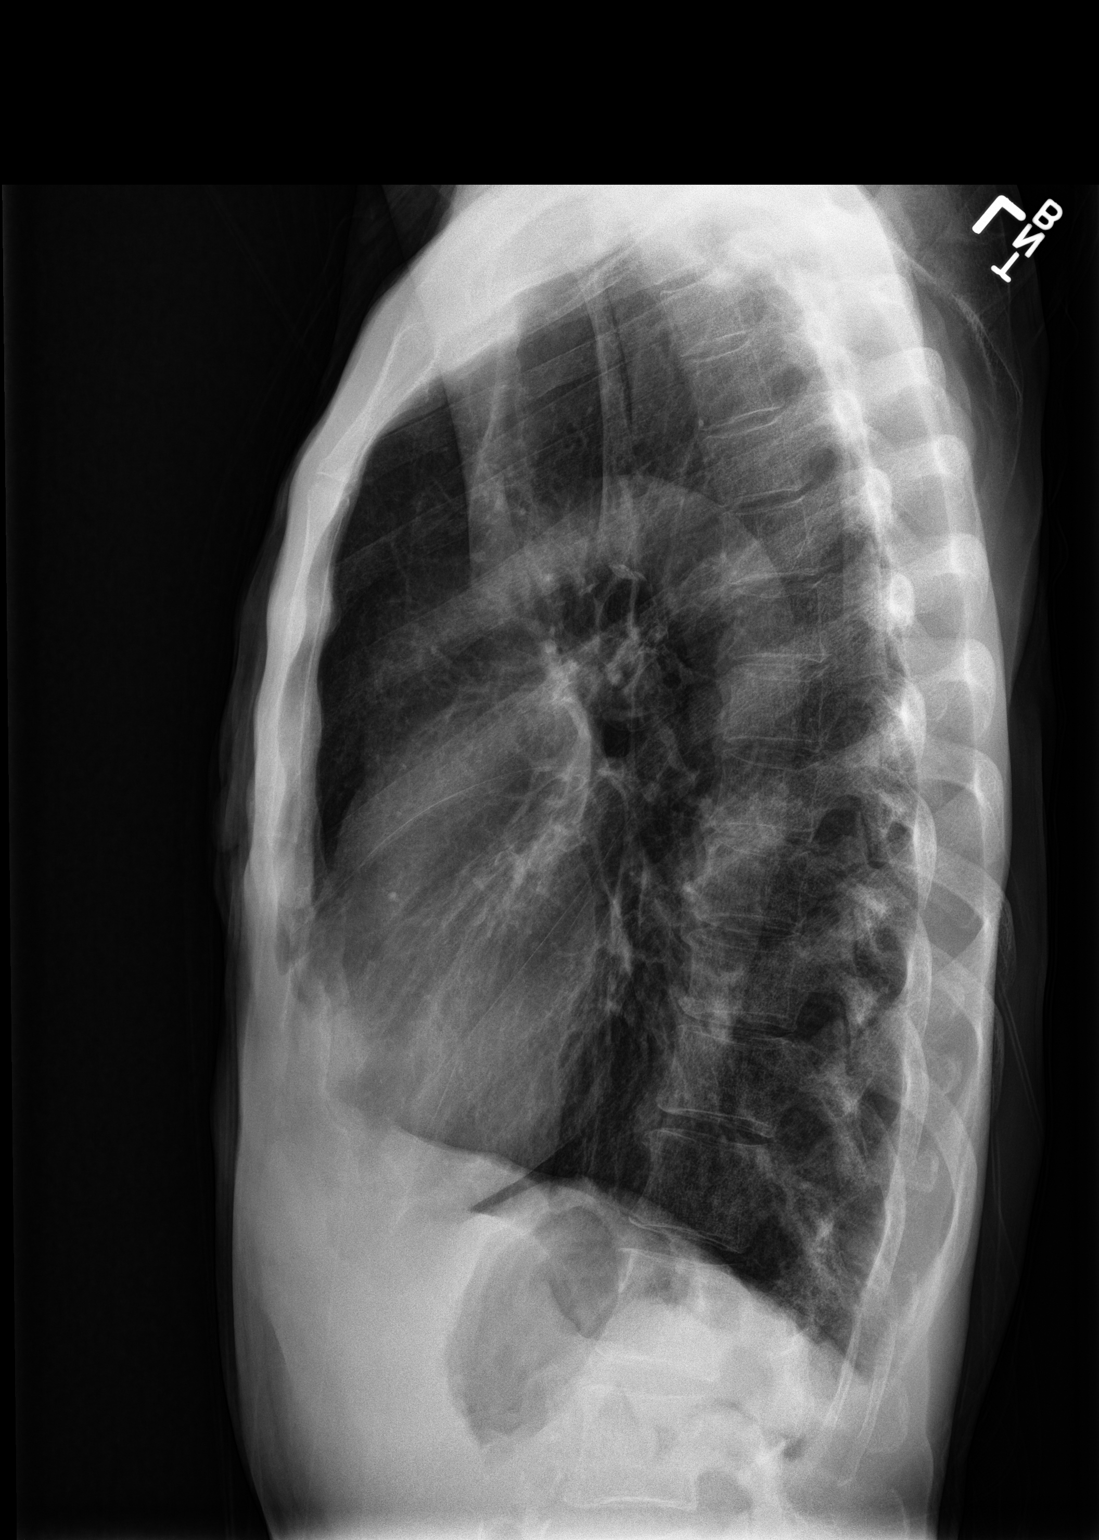

[2 of 2 positions shown; findings below may reference images not displayed]

FINDINGS: Hyperinflation of the lungs. Atherosclerotic change in the aortic
arch and descending thoracic aorta. Heart is normal size. Lungs are
clear. No effusions or acute bony abnormality.
IMPRESSION: COPD.  No active disease.

## 2017-09-22 ENCOUNTER — Other Ambulatory Visit (HOSPITAL_COMMUNITY): Payer: Self-pay | Admitting: Family Medicine

## 2017-09-22 DIAGNOSIS — Z1231 Encounter for screening mammogram for malignant neoplasm of breast: Secondary | ICD-10-CM

## 2017-09-28 ENCOUNTER — Ambulatory Visit (HOSPITAL_COMMUNITY): Payer: Medicare HMO

## 2021-05-31 ENCOUNTER — Emergency Department (HOSPITAL_COMMUNITY)
Admission: EM | Admit: 2021-05-31 | Discharge: 2021-05-31 | Disposition: A | Discharge status: Home / Self Care | Payer: Medicare HMO | Attending: Emergency Medicine | Admitting: Emergency Medicine

## 2021-05-31 ENCOUNTER — Emergency Department (HOSPITAL_COMMUNITY): Payer: Medicare HMO

## 2021-05-31 ENCOUNTER — Other Ambulatory Visit: Payer: Self-pay

## 2021-05-31 ENCOUNTER — Encounter (HOSPITAL_COMMUNITY): Payer: Self-pay

## 2021-05-31 DIAGNOSIS — F17210 Nicotine dependence, cigarettes, uncomplicated: Secondary | ICD-10-CM | POA: Diagnosis not present

## 2021-05-31 DIAGNOSIS — Z20822 Contact with and (suspected) exposure to covid-19: Secondary | ICD-10-CM | POA: Diagnosis not present

## 2021-05-31 DIAGNOSIS — Z79899 Other long term (current) drug therapy: Secondary | ICD-10-CM | POA: Diagnosis not present

## 2021-05-31 DIAGNOSIS — J101 Influenza due to other identified influenza virus with other respiratory manifestations: Secondary | ICD-10-CM | POA: Insufficient documentation

## 2021-05-31 DIAGNOSIS — R059 Cough, unspecified: Secondary | ICD-10-CM | POA: Diagnosis present

## 2021-05-31 LAB — BASIC METABOLIC PANEL
Anion gap: 13 (ref 5–15)
BUN: 16 mg/dL (ref 8–23)
CO2: 21 mmol/L — ABNORMAL LOW (ref 22–32)
Calcium: 8.3 mg/dL — ABNORMAL LOW (ref 8.9–10.3)
Chloride: 101 mmol/L (ref 98–111)
Creatinine, Ser: 1.42 mg/dL — ABNORMAL HIGH (ref 0.44–1.00)
GFR, Estimated: 39 mL/min — ABNORMAL LOW (ref >60)
Glucose, Bld: 136 mg/dL — ABNORMAL HIGH (ref 70–99)
Potassium: 3.4 mmol/L — ABNORMAL LOW (ref 3.5–5.1)
Sodium: 135 mmol/L (ref 135–145)

## 2021-05-31 LAB — CBC
HCT: 41.8 % (ref 36.0–46.0)
Hemoglobin: 13.8 g/dL (ref 12.0–15.0)
MCH: 31.7 pg (ref 26.0–34.0)
MCHC: 33 g/dL (ref 30.0–36.0)
MCV: 96.1 fL (ref 80.0–100.0)
Platelets: 136 10*3/uL — ABNORMAL LOW (ref 150–400)
RBC: 4.35 MIL/uL (ref 3.87–5.11)
RDW: 12.8 % (ref 11.5–15.5)
WBC: 4.5 10*3/uL (ref 4.0–10.5)
nRBC: 0 % (ref 0.0–0.2)

## 2021-05-31 LAB — RESP PANEL BY RT-PCR (FLU A&B, COVID) ARPGX2
Influenza A by PCR: POSITIVE — AB
Influenza B by PCR: NEGATIVE
SARS Coronavirus 2 by RT PCR: NEGATIVE

## 2021-05-31 MED ORDER — OSELTAMIVIR PHOSPHATE 75 MG PO CAPS: 75.0000 mg | ORAL_CAPSULE | Freq: Two times a day (BID) | ORAL | 0 refills | Status: DC

## 2021-05-31 MED ORDER — AEROCHAMBER PLUS FLO-VU MEDIUM MISC
1.0000 | Freq: Once | Status: AC
  Administered 2021-05-31: 1

## 2021-05-31 MED ORDER — ALBUTEROL SULFATE HFA 108 (90 BASE) MCG/ACT IN AERS
2.0000 | INHALATION_SPRAY | Freq: Once | RESPIRATORY_TRACT | Status: AC
  Administered 2021-05-31: 2 via RESPIRATORY_TRACT
  Filled 2021-05-31: qty 6.7

## 2021-05-31 MED ORDER — PREDNISONE 10 MG (21) PO TBPK: ORAL_TABLET | Freq: Every day | ORAL | 0 refills | Status: DC

## 2021-05-31 NOTE — ED Triage Notes (Signed)
Pt presents to ED with complaints of cough and fever up to 101. Pt states she wants a covid test.

## 2021-05-31 NOTE — ED Notes (Signed)
Dc instructions and scripts reviewed with pt. No questions or concerns at this time. Pt denied wheelchair and ambulated to lobby without difficulty.

## 2021-05-31 NOTE — ED Provider Notes (Signed)
[facility] EMERGENCY DEPARTMENT Provider Note   CSN: [id number] Arrival date & time: 05/31/21  4562     History Chief Complaint  Patient presents with   Cough    Rita Yang is a 70 y.o. female.  Pt presents to the ED today with cough and fever.  Pt said she has been sick for a few days.  She is here to get checked for Covid.  She had 1 dose of the Covid vaccine, but no boosters.  She is allergic to the flu vaccine.  She did take some asa this am for her fever.      Past Medical History:  Diagnosis Date   Anxiety    Bronchitis     Patient Active Problem List   Diagnosis Date Noted   Acute left flank pain 08/29/2015    Past Surgical History:  Procedure Laterality Date   HAND SURGERY       OB History   No obstetric history on file.     Family History  Problem Relation Age of Onset   Colon cancer Neg Hx    Colon polyps Neg Hx     Social History   Tobacco Use   Smoking status: Every Day    Packs/day: 0.50    Types: Cigarettes  Substance Use Topics   Alcohol use: No   Drug use: No    Home Medications Prior to Admission medications   Medication Sig Start Date End Date Taking? Authorizing Provider  oseltamivir (TAMIFLU) 75 MG capsule Take 1 capsule (75 mg total) by mouth every 12 (twelve) hours. 05/31/21  Yes Isla Pence, MD  predniSONE (STERAPRED UNI-PAK 21 TAB) 10 MG (21) TBPK tablet Take by mouth daily. Take 6 tabs by mouth daily  for 2 days, then 5 tabs for 2 days, then 4 tabs for 2 days, then 3 tabs for 2 days, 2 tabs for 2 days, then 1 tab by mouth daily for 2 days 05/31/21  Yes Isla Pence, MD  acetaminophen (TYLENOL) 500 MG tablet Take 1,000 mg by mouth every 6 (six) hours as needed for mild pain.    [provider]  albuterol (PROVENTIL HFA;VENTOLIN HFA) 108 (90 Base) MCG/ACT inhaler Inhale 1-2 puffs into the lungs every 6 (six) hours as needed for wheezing or shortness of breath.    [provider]  azithromycin  (ZITHROMAX) 250 MG tablet Take 2 tablets by mouth on day one followed by one tablet daily for 4 days. Patient not taking: Reported on 09/18/2015 09/08/15   Evalee Jefferson, PA-C  cephALEXin (KEFLEX) 500 MG capsule Take 1 capsule (500 mg total) by mouth 4 (four) times daily. Patient not taking: Reported on 08/24/2015 08/18/15   Daleen Bo, MD  cyclobenzaprine (FLEXERIL) 5 MG tablet Take 1 tablet (5 mg total) by mouth 3 (three) times daily as needed. 08/25/15   Rolland Porter, MD  Dextromethorphan HBr (ROBITUSSIN COUGHGELS) 15 MG CAPS Take 2 capsules by mouth daily as needed (cough).    [provider]  diazepam (VALIUM) 5 MG tablet Take 1 tablet by mouth every 8 (eight) hours as needed for anxiety.  05/01/15   [provider]  ibuprofen (ADVIL,MOTRIN) 800 MG tablet Take 1 tablet (800 mg total) by mouth 3 (three) times daily. 08/14/15   Noemi Chapel, MD  Na Sulfate-K Sulfate-Mg Sulf (SUPREP BOWEL PREP) SOLN Take 1 kit by mouth as directed. 08/29/15   Fields, Marga Melnick, MD  naproxen (NAPROSYN) 250 MG tablet Take 1 po BID with  food prn pain Patient taking differently: Take 250 mg by mouth 2 (two) times daily with a meal.  08/25/15   Rolland Porter, MD  omeprazole (PRILOSEC) 20 MG capsule Take 1 capsule (20 mg total) by mouth daily. 08/29/15   Danie Binder, MD    Allergies    Other  Review of Systems   Review of Systems  Respiratory:  Positive for cough.   All other systems reviewed and are negative.  Physical Exam Updated Vital Signs BP 135/85 (BP Location: Left Arm)   Pulse 93   Temp 98.7 F (37.1 C) (Oral)   Resp 20   Ht '5\' 2"'  (1.575 m)   Wt 39.5 kg   SpO2 92%   BMI 15.91 kg/m   Physical Exam Vitals and nursing note reviewed.  Constitutional:      Appearance: Normal appearance.  HENT:     Head: Normocephalic and atraumatic.     Right Ear: External ear normal.     Left Ear: External ear normal.     Nose: Nose normal.     Mouth/Throat:     Mouth: Mucous membranes are moist.      Pharynx: Oropharynx is clear.  Eyes:     Extraocular Movements: Extraocular movements intact.     Conjunctiva/sclera: Conjunctivae normal.     Pupils: Pupils are equal, round, and reactive to light.  Cardiovascular:     Rate and Rhythm: Normal rate and regular rhythm.     Pulses: Normal pulses.     Heart sounds: Normal heart sounds.  Pulmonary:     Effort: Pulmonary effort is normal.     Breath sounds: Normal breath sounds.  Abdominal:     General: Abdomen is flat. Bowel sounds are normal.     Palpations: Abdomen is soft.  Musculoskeletal:        General: Normal range of motion.     Cervical back: Normal range of motion and neck supple.  Skin:    General: Skin is warm.     Capillary Refill: Capillary refill takes less than 2 seconds.  Neurological:     General: No focal deficit present.     Mental Status: She is alert and oriented to person, place, and time.  Psychiatric:        Mood and Affect: Mood normal.        Behavior: Behavior normal.    ED Results / Procedures / Treatments   Labs (all labs ordered are listed, but only abnormal results are displayed) Labs Reviewed  RESP PANEL BY RT-PCR (FLU A&B, COVID) ARPGX2 - Abnormal; Notable for the following components:      Result Value   Influenza A by PCR POSITIVE (*)    All other components within normal limits  BASIC METABOLIC PANEL - Abnormal; Notable for the following components:   Potassium 3.4 (*)    CO2 21 (*)    Glucose, Bld 136 (*)    Creatinine, Ser 1.42 (*)    Calcium 8.3 (*)    GFR, Estimated 39 (*)    All other components within normal limits  CBC - Abnormal; Notable for the following components:   Platelets 136 (*)    All other components within normal limits    EKG None  Radiology DG Chest Portable 1 View  Result Date: 05/31/2021 CLINICAL DATA:  Cough fever EXAM: PORTABLE CHEST 1 VIEW COMPARISON:  11/20/2015 FINDINGS: Cardiac size is within normal limits. Thoracic aorta is tortuous. There are  no signs of  pulmonary edema or focal pulmonary consolidation. Left hemidiaphragm is elevated. There is no pleural effusion or pneumothorax. Low position of diaphragms suggests COPD. IMPRESSION: COPD. There are no signs of pulmonary edema or focal pulmonary consolidation. Electronically Signed   By: Elmer Picker M.D.   On: 05/31/2021 10:45    Procedures Procedures   Medications Ordered in ED Medications  albuterol (VENTOLIN HFA) 108 (90 Base) MCG/ACT inhaler 2 puff (2 puffs Inhalation Given 05/31/21 0944)  AeroChamber Plus Flo-Vu Medium MISC 1 each (1 each Other Given 05/31/21 0944)    ED Course  I have reviewed the triage vital signs and the nursing notes.  Pertinent labs & imaging results that were available during my care of the patient were reviewed by me and considered in my medical decision making (see chart for details).    MDM Rules/Calculators/A&P                           Pt is + for the flu.  She is not hypoxic and appears well.  No pna on CXR.  Pt d/c with prednisone and tamiflu.  She is given an albuterol inhaler and spacer.  She is to return if worse.  F/u with pcp.  Final Clinical Impression(s) / ED Diagnoses Final diagnoses:  Influenza A    Rx / DC Orders ED Discharge Orders          Ordered    oseltamivir (TAMIFLU) 75 MG capsule  Every 12 hours        05/31/21 1059    predniSONE (STERAPRED UNI-PAK 21 TAB) 10 MG (21) TBPK tablet  Daily        05/31/21 1059             Isla Pence, MD 05/31/21 1106

## 2021-07-19 ENCOUNTER — Encounter (HOSPITAL_COMMUNITY): Payer: Self-pay

## 2021-07-19 ENCOUNTER — Other Ambulatory Visit: Payer: Self-pay

## 2021-07-19 ENCOUNTER — Emergency Department (HOSPITAL_COMMUNITY)
Admission: EM | Admit: 2021-07-19 | Discharge: 2021-07-19 | Disposition: A | Discharge status: Home / Self Care | Payer: Medicare HMO | Attending: Emergency Medicine | Admitting: Emergency Medicine

## 2021-07-19 ENCOUNTER — Emergency Department (HOSPITAL_COMMUNITY): Payer: Medicare HMO

## 2021-07-19 DIAGNOSIS — R030 Elevated blood-pressure reading, without diagnosis of hypertension: Secondary | ICD-10-CM

## 2021-07-19 DIAGNOSIS — U071 COVID-19: Secondary | ICD-10-CM | POA: Insufficient documentation

## 2021-07-19 DIAGNOSIS — R059 Cough, unspecified: Secondary | ICD-10-CM | POA: Diagnosis present

## 2021-07-19 LAB — RESP PANEL BY RT-PCR (FLU A&B, COVID) ARPGX2
Influenza A by PCR: NEGATIVE
Influenza B by PCR: NEGATIVE
SARS Coronavirus 2 by RT PCR: POSITIVE — AB

## 2021-07-19 MED ORDER — NIRMATRELVIR/RITONAVIR (PAXLOVID)TABLET: 2.0000 | ORAL_TABLET | Freq: Two times a day (BID) | ORAL | 0 refills | Status: AC

## 2021-07-19 NOTE — ED Triage Notes (Signed)
Pt presents to ED from home with c/o of cough and congestion that started Tuesday, no fever, pt has not seen PCP.

## 2021-07-19 NOTE — Discharge Instructions (Signed)
Drink plenty of fluids. Take acetaminophen or ibuprofen as needed for fever or aching. °

## 2021-07-19 NOTE — ED Provider Notes (Signed)
[facility] EMERGENCY DEPARTMENT Provider Note   CSN: [id number] Arrival date & time: 07/19/21  2018     History  Chief Complaint  Patient presents with   Cough    Rita Yang is a 71 y.o. female.  The history is provided by the patient.  Cough She has history of anxiety, bronchitis and comes in with 3-day history of fever and cough.  Fever has been as high as 100.0, but she has not had a fever over the last 2 days.  She denies chills or sweats.  Cough has been productive of cloudy sputum.  She denies dyspnea.  She denies headache, sore throat, chest pain.  She denies nausea or vomiting.  She did take some cough medicine which she feels gave her some diarrhea, but she is not currently having any diarrhea.  She denies arthralgias or myalgias.  She has had a sick contact-her husband has been diagnosed with COVID-19.  Of note, she has only received 1 dose of the COVID-vaccine, and is allergic to the influenza vaccine.   Home Medications Prior to Admission medications   Medication Sig Start Date End Date Taking? Authorizing Provider  nirmatrelvir/ritonavir EUA (PAXLOVID) 20 x 150 MG & 10 x 100MG TABS Take 2 tablets by mouth 2 (two) times daily for 5 days. Patient GFR is 39. Take nirmatrelvir (150 mg) one tablet twice daily for 5 days and ritonavir (100 mg) one tablet twice daily for 5 days. 10/16/79 8/56/31 Yes Delora Fuel, MD  acetaminophen (TYLENOL) 500 MG tablet Take 1,000 mg by mouth every 6 (six) hours as needed for mild pain.    [provider]  albuterol (PROVENTIL HFA;VENTOLIN HFA) 108 (90 Base) MCG/ACT inhaler Inhale 1-2 puffs into the lungs every 6 (six) hours as needed for wheezing or shortness of breath.    [provider]  cyclobenzaprine (FLEXERIL) 5 MG tablet Take 1 tablet (5 mg total) by mouth 3 (three) times daily as needed. 08/25/15   Rolland Porter, MD  Dextromethorphan HBr (ROBITUSSIN COUGHGELS) 15 MG CAPS Take 2 capsules by mouth daily as needed (cough).     [provider]  diazepam (VALIUM) 5 MG tablet Take 1 tablet by mouth every 8 (eight) hours as needed for anxiety.  05/01/15   [provider]  ibuprofen (ADVIL,MOTRIN) 800 MG tablet Take 1 tablet (800 mg total) by mouth 3 (three) times daily. 08/14/15   Noemi Chapel, MD  Na Sulfate-K Sulfate-Mg Sulf (SUPREP BOWEL PREP) SOLN Take 1 kit by mouth as directed. 08/29/15   Fields, Marga Melnick, MD  naproxen (NAPROSYN) 250 MG tablet Take 1 po BID with food prn pain Patient taking differently: Take 250 mg by mouth 2 (two) times daily with a meal.  08/25/15   Rolland Porter, MD  omeprazole (PRILOSEC) 20 MG capsule Take 1 capsule (20 mg total) by mouth daily. 08/29/15   Fields, Marga Melnick, MD  oseltamivir (TAMIFLU) 75 MG capsule Take 1 capsule (75 mg total) by mouth every 12 (twelve) hours. 05/31/21   Isla Pence, MD      Allergies    Other    Review of Systems   Review of Systems  Respiratory:  Positive for cough.   All other systems reviewed and are negative.  Physical Exam Updated Vital Signs BP (!) 180/99 (BP Location: Right Arm)    Pulse 81    Temp 98.6 F (37 C) (Oral)    Resp 16    Ht 5' 4" (1.626 m)  Wt 36.3 kg    SpO2 98%    BMI 13.73 kg/m  Physical Exam Vitals and nursing note reviewed.  71 year old female, resting comfortably and in no acute distress. Vital signs are significant for elevated blood pressure. Oxygen saturation is 98%, which is normal. Head is normocephalic and atraumatic. PERRLA, EOMI. Oropharynx is clear. Neck is nontender and supple without adenopathy or JVD. Back is nontender and there is no CVA tenderness. Lungs are clear without rales, wheezes, or rhonchi. Chest is nontender. Heart has regular rate and rhythm without murmur. Abdomen is soft, flat, nontender. Extremities have no cyanosis or edema, full range of motion is present. Skin is warm and dry without rash. Neurologic: Mental status is normal, cranial nerves are intact, moves all extremities  equally.  ED Results / Procedures / Treatments   Labs (all labs ordered are listed, but only abnormal results are displayed) Labs Reviewed  RESP PANEL BY RT-PCR (FLU A&B, COVID) ARPGX2 - Abnormal; Notable for the following components:      Result Value   SARS Coronavirus 2 by RT PCR POSITIVE (*)    All other components within normal limits   Radiology DG Chest Port 1 View  Result Date: 07/19/2021 CLINICAL DATA:  Cough and congestion. EXAM: PORTABLE CHEST 1 VIEW COMPARISON:  05/31/2021 FINDINGS: Stable cardiomediastinal contours. Aortic atherosclerotic calcifications. No pleural effusion or edema identified. Lungs are hyperinflated but clear. Coarsened interstitial markings suggest COPD/emphysema. IMPRESSION: No acute cardiopulmonary abnormalities. Electronically Signed   By: Kerby Moors M.D.   On: 07/19/2021 21:15    Procedures Procedures    Medications Ordered in ED Medications - No data to display  ED Course/ Medical Decision Making/ A&P                           Medical Decision Making  Viral URI-possibly influenza, possibly COVID-19, rule out pneumonia.  Chest x-ray is obtained and shows no evidence of pneumonia.  I have independently viewed the images and agree with radiologist interpretation.  Respiratory pathogen panel is positive for COVID-19.  Patient does have pre-existing conditions of bronchitis and age, is a candidate for antiviral therapy.  She is given a prescription for Paxlovid.  Old records are reviewed, and she does have a prior ED visit for bronchitis and for pneumonia.  Currently, no evidence of bronchospasm, no need for steroids or beta-2 agonists.  Advised to use over-the-counter medications for cough.  Return precautions discussed.        Final Clinical Impression(s) / ED Diagnoses Final diagnoses:  COVID-19 virus infection    Rx / DC Orders ED Discharge Orders          Ordered    nirmatrelvir/ritonavir EUA (PAXLOVID) 20 x 150 MG & 10 x 100MG  TABS  2 times daily        07/19/21 6384              Delora Fuel, MD 66/59/93 2324

## 2021-12-06 ENCOUNTER — Other Ambulatory Visit (HOSPITAL_COMMUNITY): Payer: Self-pay | Admitting: Nurse Practitioner

## 2021-12-06 ENCOUNTER — Other Ambulatory Visit: Payer: Self-pay | Admitting: Nurse Practitioner

## 2021-12-06 DIAGNOSIS — M79605 Pain in left leg: Secondary | ICD-10-CM

## 2021-12-10 ENCOUNTER — Encounter (HOSPITAL_COMMUNITY): Payer: Self-pay

## 2021-12-10 ENCOUNTER — Other Ambulatory Visit: Payer: Self-pay

## 2021-12-10 ENCOUNTER — Emergency Department (HOSPITAL_COMMUNITY)
Admission: EM | Admit: 2021-12-10 | Discharge: 2021-12-11 | Disposition: A | Discharge status: Home / Self Care | Payer: Medicare HMO | Attending: Emergency Medicine | Admitting: Emergency Medicine

## 2021-12-10 ENCOUNTER — Emergency Department (HOSPITAL_COMMUNITY): Payer: Medicare HMO

## 2021-12-10 ENCOUNTER — Ambulatory Visit (HOSPITAL_COMMUNITY)
Admission: RE | Admit: 2021-12-10 | Discharge: 2021-12-10 | Disposition: A | Discharge status: Home / Self Care | Payer: Medicare HMO | Source: Ambulatory Visit | Attending: Nurse Practitioner | Admitting: Nurse Practitioner

## 2021-12-10 DIAGNOSIS — I70219 Atherosclerosis of native arteries of extremities with intermittent claudication, unspecified extremity: Secondary | ICD-10-CM | POA: Insufficient documentation

## 2021-12-10 DIAGNOSIS — M79605 Pain in left leg: Secondary | ICD-10-CM | POA: Insufficient documentation

## 2021-12-10 DIAGNOSIS — R600 Localized edema: Secondary | ICD-10-CM | POA: Diagnosis present

## 2021-12-10 DIAGNOSIS — I739 Peripheral vascular disease, unspecified: Secondary | ICD-10-CM

## 2021-12-10 LAB — CBC WITH DIFFERENTIAL/PLATELET
Abs Immature Granulocytes: 0.01 10*3/uL (ref 0.00–0.07)
Basophils Absolute: 0.1 10*3/uL (ref 0.0–0.1)
Basophils Relative: 1 %
Eosinophils Absolute: 0.8 10*3/uL — ABNORMAL HIGH (ref 0.0–0.5)
Eosinophils Relative: 12 %
HCT: 41.7 % (ref 36.0–46.0)
Hemoglobin: 13.6 g/dL (ref 12.0–15.0)
Immature Granulocytes: 0 %
Lymphocytes Relative: 20 %
Lymphs Abs: 1.4 10*3/uL (ref 0.7–4.0)
MCH: 31.6 pg (ref 26.0–34.0)
MCHC: 32.6 g/dL (ref 30.0–36.0)
MCV: 97 fL (ref 80.0–100.0)
Monocytes Absolute: 0.7 10*3/uL (ref 0.1–1.0)
Monocytes Relative: 11 %
Neutro Abs: 3.9 10*3/uL (ref 1.7–7.7)
Neutrophils Relative %: 56 %
Platelets: 255 10*3/uL (ref 150–400)
RBC: 4.3 MIL/uL (ref 3.87–5.11)
RDW: 12.7 % (ref 11.5–15.5)
WBC: 7 10*3/uL (ref 4.0–10.5)
nRBC: 0 % (ref 0.0–0.2)

## 2021-12-10 LAB — BASIC METABOLIC PANEL
Anion gap: 9 (ref 5–15)
BUN: 18 mg/dL (ref 8–23)
CO2: 30 mmol/L (ref 22–32)
Calcium: 9.6 mg/dL (ref 8.9–10.3)
Chloride: 101 mmol/L (ref 98–111)
Creatinine, Ser: 1.11 mg/dL — ABNORMAL HIGH (ref 0.44–1.00)
GFR, Estimated: 52 mL/min — ABNORMAL LOW (ref >60)
Glucose, Bld: 94 mg/dL (ref 70–99)
Potassium: 3.9 mmol/L (ref 3.5–5.1)
Sodium: 140 mmol/L (ref 135–145)

## 2021-12-10 LAB — BRAIN NATRIURETIC PEPTIDE: B Natriuretic Peptide: 29 pg/mL (ref 0.0–100.0)

## 2021-12-10 LAB — TROPONIN I (HIGH SENSITIVITY): Troponin I (High Sensitivity): 12 ng/L (ref <18)

## 2021-12-10 NOTE — ED Triage Notes (Signed)
Pt arrived POV from home stating she had a scan done today and they told her to come here she has a blood clot in her left leg.

## 2021-12-10 NOTE — ED Provider Triage Note (Signed)
Emergency Medicine Provider Triage Evaluation Note  Rita Yang , a 71 y.o. female  was evaluated in triage.  Pt complains of possible DVT.  Have a US Doppler of the left lower extremity performed today at [facility].  Was recommended for the study due to left calf pain when she walks.  Was contacted to come to the ED for further evaluation.  Denies chest pain, shortness of breath, fever, headache, vision changes, new numbness/tingling of the lower extremities.  Review of Systems  Positive:  Negative: As above  Physical Exam  BP (!) 182/103 (BP Location: Right Arm)   Pulse 93   Temp 99 F (37.2 C) (Oral)   Resp 18   Ht 5\' 2"  (1.575 m)   Wt 38.6 kg   SpO2 96%   BMI 15.55 kg/m  Gen:   Awake, no distress   Resp:  Normal effort, CTAB MSK:   Moves extremities without difficulty  Other:  Mild tenderness to left calf, without significant swelling or warmth or erythema.  Abdomen soft nontender.  Heart rate 93 bpm.  Hypertensive at 192/103.  Medical Decision Making  Medically screening exam initiated at 8:12 PM.  Appropriate orders placed.  Rita Yang was informed that the remainder of the evaluation will be completed by another provider, this initial triage assessment does not replace that evaluation, and the importance of remaining in the ED until their evaluation is complete.  Labs, imaging, EKG ordered   Rita Yang, Cecil Cobbs 12/10/21 2023

## 2021-12-11 ENCOUNTER — Emergency Department (HOSPITAL_COMMUNITY): Payer: Medicare HMO

## 2021-12-11 LAB — TROPONIN I (HIGH SENSITIVITY): Troponin I (High Sensitivity): 12 ng/L (ref <18)

## 2021-12-11 MED ORDER — ASPIRIN 81 MG PO CHEW: 81.0000 mg | CHEWABLE_TABLET | Freq: Every day | ORAL | 0 refills | Status: AC

## 2021-12-11 MED ORDER — ROSUVASTATIN CALCIUM 20 MG PO TABS: 20.0000 mg | ORAL_TABLET | Freq: Every day | ORAL | 0 refills | Status: DC

## 2021-12-11 MED ORDER — IOHEXOL 350 MG/ML SOLN
100.0000 mL | Freq: Once | INTRAVENOUS | Status: AC | PRN
  Administered 2021-12-11: 100 mL via INTRAVENOUS

## 2021-12-11 MED ORDER — NICOTINE 21 MG/24HR TD PT24: 21.0000 mg | MEDICATED_PATCH | Freq: Every day | TRANSDERMAL | 0 refills | Status: DC

## 2021-12-11 NOTE — ED Notes (Signed)
Patient verbalizes understanding of discharge instructions. Opportunity for questioning and answers were provided. Armband removed by staff, pt discharged from ED pt ambulatory to lobby  

## 2021-12-11 NOTE — ED Provider Notes (Signed)
Emergency Department Provider Note   I have reviewedLechelle Wrigleyiage vital signs and the nursing notes.   HISTORY  Chief Complaint Leg Pain   HPI Rita Yang is a 71 y.o. female with past medical history reviewed below presents emergency department with left leg pain.  Patient states she had pain in the left leg over the past several months.  She is not having any rest pain.  She typically has discomfort when she is up and walking especially on hard surfaces.  She tells me she does not always have pain with movement but ultimately had outpatient DVT ultrasound the left leg which showed abnormal findings and she was referred to the emergency department.  She not having any chest pain or shortness of breath.  No discoloration or rash to the foot or lower leg. No injury.   Past Medical History:  Diagnosis Date   Anxiety    Bronchitis     Review of Systems  Constitutional: No fever/chills Eyes: No visual changes. ENT: No sore throat. Cardiovascular: Denies chest pain. Respiratory: Denies shortness of breath. Gastrointestinal: No abdominal pain.  No nausea, no vomiting.  No diarrhea.  No constipation. Genitourinary: Negative for dysuria. Musculoskeletal: Negative for back pain. Positive left leg pain.  Skin: Negative for rash. Neurological: Negative for headaches, focal weakness or numbness.   ____________________________________________   PHYSICAL EXAM:  VITAL SIGNS: ED Triage Vitals  Enc Vitals Group     BP 12/10/21 1735 (!) 182/103     Pulse Rate 12/10/21 1735 93     Resp 12/10/21 1735 18     Temp 12/10/21 1735 99 F (37.2 C)     Temp Source 12/10/21 1735 Oral     SpO2 12/10/21 1735 96 %     Weight 12/10/21 1738 85 lb (38.6 kg)     Height 12/10/21 1738  (1.575 m)   Constitutional: Alert and oriented. Well appearing and in no acute distress. Eyes: Conjunctivae are normal.  Head: Atraumatic. Nose: No congestion/rhinnorhea. Mouth/Throat: Mucous membranes are  moist.   Neck: No stridor.   Cardiovascular: Normal rate, regular rhythm. Grossly normal heart sounds.  Overall good circulation in the left lower extremity.  She has 1+ pulses in the DP and PT which were confirmed with bedside Doppler.  Respiratory: Normal respiratory effort.  No retractions. Lungs CTAB. Gastrointestinal: Soft and nontender. No distention.  Musculoskeletal: No lower extremity tenderness nor edema. No gross deformities of extremities. Neurologic:  Normal speech and language. No gross focal neurologic deficits are appreciated.  Skin:  Skin is warm, dry and intact. No rash noted.   ____________________________________________   LABS (all labs ordered are listed, but only abnormal results are displayed)  Labs Reviewed  BASIC METABOLIC PANEL - Abnormal; Notable for the following components:      Result Value   Creatinine, Ser 1.11 (*)    GFR, Estimated 52 (*)    All other components within normal limits  CBC WITH DIFFERENTIAL/PLATELET - Abnormal; Notable for the following components:   Eosinophils Absolute 0.8 (*)    All other components within normal limits  BRAIN NATRIURETIC PEPTIDE  TROPONIN I (HIGH SENSITIVITY)  TROPONIN I (HIGH SENSITIVITY)   ____________________________________________  RADIOLOGY  DG Chest 2 View  Result Date: 12/10/2021 CLINICAL DATA:  Decreased oxygen saturation, tachycardia EXAM: CHEST - 2 VIEW COMPARISON:  07/19/2021 FINDINGS: Frontal and lateral views of the chest demonstrate a stable cardiac silhouette. Lungs are hyperinflated with background scarring compatible with emphysema. No acute airspace  disease, effusion, or pneumothorax. No acute bony abnormalities. IMPRESSION: 1. Emphysema.  No acute airspace disease. Electronically Signed   By: Sharlet SalinaMichael  Brown M.D.   On: 12/10/2021 20:40   CT Angio Aortobifemoral W and/or Wo Contrast  Result Date: 12/11/2021 CLINICAL DATA:  Claudication or leg ischemia. Yesterday had left lower extremity DVT  exam showing superficial thrombophlebitis in the left gastrocnemius vein and age-indeterminate occlusion of the left femoral artery. EXAM: CT ANGIOGRAPHY OF ABDOMINAL AORTA WITH ILIOFEMORAL RUNOFF TECHNIQUE: Multidetector CT imaging of the abdomen, pelvis and lower extremities was performed using the standard protocol during bolus administration of intravenous contrast. Multiplanar CT image reconstructions and MIPs were obtained to evaluate the vascular anatomy. RADIATION DOSE REDUCTION: This exam was performed according to the departmental dose-optimization program which includes automated exposure control, adjustment of the mA and/or kV according to patient size and/or use of iterative reconstruction technique. CONTRAST:  100mL OMNIPAQUE IOHEXOL 350 MG/ML SOLN COMPARISON:  No prior runoff study. Comparison is made with CT of abdomen and pelvis with IV contrast 08/24/2015. FINDINGS: VASCULAR Aorta: Nonaneurysmal ectasia is noted in the suprarenal segment which measures up to 2.6 cm caliber. Remainder of the abdominal aorta is tortuous with heavy calcific plaques and minimal scattered soft plaques without aneurysm, stenosis or dissection. Celiac: There are ostial calcifications without flow-limiting stenosis. SMA: There are mild scattered calcifications without flow-limiting stenosis. The SMA and aorta are essentially parallel structures in this patient. This may predispose to SMA compression of the duodenum and impaired gastric emptying. This was seen in 2017 also. Not seen in 2017, there is a counter-clockwise swirling of the mesenteric vessels on series 7 axial images 57-72, which may be seen with internal hernia formation. No entrapped bowel is seen. Renals: Both renal arteries are single. Left renal artery is patent with mild calcifications, right renal artery demonstrates a 75% calcific origin stenosis and is otherwise patent. IMA: There is a high-grade origin stenosis. The vessel otherwise opacifies well.  RIGHT Lower Extremity Inflow: There are heavy calcifications without stenosis in the common iliac arteries. There is heavy calcification with up to 50% stenosis in the internal iliac artery. There are heavy mixed plaques in the external iliac artery with up to 70% irregular stenosis along the external iliac artery. There is no aneurysm or dissection. Outflow: There are moderate calcifications in the common femoral artery causing up to 40% vessel stenosis. There are nonstenosing calcifications in the proximal deep femoral artery with patent circumflex femoral arteries. In the superficial femoral artery there is heavy calcification proximally and distally, with 11 cm in length occlusion of the superficial femoral artery beginning 3 cm distal to its origin, with thready reconstitution just proximal to the Hunter's canal segment which itself is up to 80% irregularly stenotic. There are calcifications in the popliteal artery with no more than 40% popliteal artery stenosis. Runoff: There scattered nonstenosing calcifications of the tibioperoneal trunk proximal posterior tibial artery. Posterior tibial artery is occluded. There are scattered calcifications in the anterior tibial artery with up to 50% stenosis in the midportion, otherwise no focal stenosis is seen with runoff into the dorsalis pedis. The peroneal artery shows no focal stenosis and runs off into the foot as well. Flow is seen in the plantar arterial arch apparently reconstituting either from muscular perforators or collateral flow with the anterior tibial or peroneal artery. LEFT Lower Extremity Inflow: There is heavy calcification in the common femoral artery without stenosis, dissection or aneurysm. Heavy calcifications cause moderate stenosis of the internal  iliac artery with heavy mixed plaques in the external iliac artery causing severe high-grade stenosis in the mid segment of the vessel reconstituting to normal caliber distally above the inguinal  ligament. Outflow: Moderate mixed plaques in the common femoral artery are seen with up to 60% vessel stenosis. No flow-limiting stenosis is seen in the deep femoral artery and circumflex femoral arteries which opacify well. The superficial femoral artery is occluded shortly distal to its origin by thrombosis with mild scattered calcifications in the proximal vessel and more heavy calcification in the Hunter's canal segment. There is thready reconstitution in the most distal vessel in the inferior Hunter's canal, aside from which there are no reconstituted segments of this artery. There are nonstenosing calcifications in the popliteal artery which otherwise shows preserved flow. Runoff: There are mild scattered plaques in the tibioperoneal trunk and anterior tibial artery. There is preserved flow in the anterior tibial artery runoff into the dorsalis pedis. The peroneal artery shows irregularly stenotic flow and occludes just above the ankle. Posterior tibial artery demonstrates alternating segments of severe stenosis and occlusion and also occludes just above the ankle but there is flow seen in the plantar arch. Veins: The veins are largely unopacified and not well evaluated. There is opacification of multiple bilateral pelvic floor and sidewall veins, consistent with pelvic venous congestion. Review of the MIP images confirms the above findings. NON-VASCULAR Lower chest: Emphysematous and scarring changes noted both lung bases with eventration and mild elevation of the left hemidiaphragm. There is mild cardiomegaly, incompletely visualized. Hepatobiliary: No focal liver abnormality allowing for the technique utilized. Gallbladder and bile ducts are unremarkable. Pancreas: No focal abnormality. Spleen: No focal abnormality. Adrenals/Urinary Tract: There is no adrenal mass, no renal cortical mass is seen. There is asymmetric cortical thinning on the right a few tiny renal cortical hypodensities which are too small  to characterize. No urinary stone or obstruction is seen. There is no bladder thickening. Stomach/Bowel: There are thickened folds in the stomach. There is a mildly dilated descending duodenum which could be due to SMA compression. No dilated small bowel is seen elsewhere. Moderate stool retention. An appendix is not seen in this patient. There are uncomplicated colonic diverticula. Lymphatic: No adenopathy is seen. Reproductive: The uterus appears to be intact but it is not well seen. No left adnexal abnormality is evident. There is homogeneous posterior right adnexal thin walled cyst measuring 19.4 Hounsfield units and 4.4 x 3.2 cm, previously 3.0 x 2.3 cm. Other: There is no incarcerated anterior wall hernia. There is no free air, hemorrhage or fluid. Musculoskeletal: There is osteopenia, mild levoscoliosis and degenerative change of the lumbar spine. There are numerable tiny lucencies in the vertebrae, sacrum, pelvis and proximal femurs. No focally destructive lesion is seen. IMPRESSION: VASCULAR 1. Heavy aortoiliac calcifications, with lower extremity inflow stenoses on the left-greater-than-right. 2. Both superficial femoral arteries are occluded over varying lengths, more so on the left, due to thrombosis. Age is indeterminate. 3. Bilateral runoff disease with right posterior tibial artery occlusion, calcifications and up to 50% stenosis in the mid segment of the right anterior tibial artery, and otherwise good two-vessel right lower extremity runoff via the ATA and peroneal artery. 4. More significant left lower extremity runoff disease. Only the anterior tibial artery runs off into the foot but there is bilateral opacification of the plantar arch arteries. 5. 75% calcific origin stenosis right renal artery, with high-grade IMA origin stenosis. 6. No other flow-limiting visceral artery stenosis is seen but there  is swirling of the mesenteric vessels in the low central abdomen compatible with an internal  hernia and mesenteric vascular twisting although no visible entrapped bowel. 7. Roughly parallel SMA and aorta which can predispose to SMA compression of the duodenum, which itself is mildly dilated suggesting impaired emptying. 8. Pelvic venous congestion. The veins are not otherwise well evaluated. NON-VASCULAR 1. Emphysema.  Mild cardiomegaly. 2. Constipation and diverticulosis. 3. 4.4 x 3.2 cm right adnexal cystic lesion probably arising from the ovary, previously 3.0 x 2.3 cm. Follow-up ultrasound recommended. 4. Hypodensities in both kidneys which are too small to characterize. 5. Tiny lucencies throughout osseous structures. This could be due to osteopenia or infiltrating marrow disease. Further evaluation recommended. Electronically Signed   By: Almira Bar M.D.   On: 12/11/2021 03:18   US Venous Img Lower Unilateral Left (DVT)  Result Date: 12/10/2021 CLINICAL DATA:  Left lower extremity pain. Evaluate for smoking. Evaluate for DVT. EXAM: LEFT LOWER EXTREMITY VENOUS DOPPLER ULTRASOUND TECHNIQUE: Gray-scale sonography with graded compression, as well as color Doppler and duplex ultrasound were performed to evaluate the lower extremity deep venous systems from the level of the common femoral vein and including the common femoral, femoral, profunda femoral, popliteal and calf veins including the posterior tibial, peroneal and gastrocnemius veins when visible. The superficial great saphenous vein was also interrogated. Spectral Doppler was utilized to evaluate flow at rest and with distal augmentation maneuvers in the common femoral, femoral and popliteal veins. COMPARISON:  CT abdomen and pelvis-08/24/2015 FINDINGS: Contralateral Common Femoral Vein: Respiratory phasicity is normal and symmetric with the symptomatic side. No evidence of thrombus. Normal compressibility. Common Femoral Vein: No evidence of thrombus. Normal compressibility, respiratory phasicity and response to augmentation.  Saphenofemoral Junction: No evidence of thrombus. Normal compressibility and flow on color Doppler imaging. Profunda Femoral Vein: No evidence of thrombus. Normal compressibility and flow on color Doppler imaging. Femoral Vein: No evidence of thrombus. Normal compressibility, respiratory phasicity and response to augmentation. Popliteal Vein: No evidence of thrombus. Normal compressibility, respiratory phasicity and response to augmentation. Calf Veins: No evidence of thrombus. Normal compressibility and flow on color Doppler imaging. Superficial Great Saphenous Vein: No evidence of thrombus. Normal compressibility. Other Findings: There is age-indeterminate thrombus involving the left gastrocnemius vein (images 43 through 45). There is age indeterminate occlusion of the left femoral artery extending from the proximal to the distal aspect the left thigh (image 15 through 25). Blunted monophasic waveforms are demonstrated within the left popliteal artery (image 60) as well as the posterior tibial artery (image 63), the left peroneal artery (image 70 as well as the left anterior tibial artery (image 77). IMPRESSION: 1. No evidence of DVT within the left lower extremity. 2. Age-indeterminate occlusive superficial thrombophlebitis involving the left gastrocnemius vein. 3. Age-indeterminate occlusion of the left femoral artery with blunted monophasic waveform demonstrated within the left popliteal and calf arterial system. Clinical correlation is advised. Further evaluation with CTA run-off could be performed as indicated. These results will be called to the ordering clinician or representative by the Radiologist Assistant, and communication documented in the PACS or [employer]. Electronically Signed   By: Simonne Come M.D.   On: 12/10/2021 10:06    ____________________________________________   PROCEDURES  Procedure(s) performed:    Procedures  None ____________________________________________   INITIAL IMPRESSION / ASSESSMENT AND PLAN / ED COURSE  Pertinent labs & imaging results that were available during my care of the patient were reviewed by me and considered in my medical  decision making (see chart for details).   This patient is Presenting for Evaluation of leg pain, which does require a range of treatment options, and is a complaint that involves a high risk of morbidity and mortality.  The Differential Diagnoses include superficial thrombophlebitis, DVT, claudication pain from PAD, compartment syndrome, fracture, MSK strain.   I did obtain Additional Historical Information from family at bedside.  I decided to review pertinent External Data, and in summary no prior encounters in our system with vascular surgery or PCP.   Clinical Laboratory Tests Ordered, included no acute kidney injury.  No leukocytosis.  Troponin and BNP are normal.   Radiologic Tests Ordered, included CXR and CTA runoff. I independently interpreted the images and agree with radiology interpretation.   Cardiac Monitor Tracing shows NSR.   Social Determinants of Health Risk patient is a smoker.   Medical Decision Making: Summary:  Patient presents emergency department with left leg pain intermittently with movement.  Could be claudication discomfort given findings on DVT ultrasound today.  Do plan to confirm with CT angio runoff but on my bedside exam I am able to palpate and confirm with Doppler both DP and PT pulses, clinically ruling out critical limb ischemia.   Consults -  Vascular Surgery, Dr. Edilia Bo. Reviewed case and imaging results by phone. No rest symptoms or critical limb ischemia today. Patient will need ASA and high dose statins. Encourage smoking cessation. Patient will need to follow with them in the office ASAP for consideration of outpatient surgical options, if any.   Reevaluation with update and discussion  with patient and family both at bedside and by phone. Discussed Vascular surgery recommendation/plan. Discussed smoking cessation plan with patient. Called in Rx for ASA, statin, and nicotine patches. Patient and family will call surgery office today to schedule a follow up along with PCP for follow up. Discussed with patient and family (along with providing in writing) ED return precautions including rest pain and ischemic limb symptoms.   Considered admission but patient without critical limb ischemia here. Good follow up established with vascular surgery and patient with good family support. Stable for d/c.   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   NICOTINE (NICODERM CQ) 21 MG/24HR PATCH    Place 1 patch (21 mg total) onto the skin daily.   ROSUVASTATIN (CRESTOR) 20 MG TABLET    Take 1 tablet (20 mg total) by mouth daily.    Note:  This document was prepared using Dragon voice recognition software and may include unintentional dictation errors.  Alona Bene, MD, [facility]    Randol Zumstein, Arlyss Repress, MD 12/11/21 [phone]

## 2021-12-11 NOTE — Discharge Instructions (Signed)
You were seen in the emergency room today with leg pain.  Your CT scan today showed multiple areas of narrowing in your arteries, which may be causing pain in your legs.  It is important that you begin taking the new medications prescribed and do your best to quit smoking as soon as possible.  It is also important that you call the vascular surgery doctor (Dr. Scot Dock) to schedule a follow-up appointment in the office this week.  They will review your emergency department CT scan and discuss treatment options with you.   It is also important that you call your primary care doctor to make them aware of your ED visit and help to coordinate care with the vascular surgery team.   You should return to the emergency department immediately if you develop sudden severe pain in your legs, color change, numbness/tingling, or if you begin having pain at rest.

## 2021-12-19 ENCOUNTER — Other Ambulatory Visit: Payer: Self-pay

## 2021-12-19 ENCOUNTER — Ambulatory Visit: Payer: Medicare HMO | Admitting: Vascular Surgery

## 2021-12-19 ENCOUNTER — Encounter: Payer: Self-pay | Admitting: Vascular Surgery

## 2021-12-19 VITALS — BP 145/88 | HR 87 | Temp 98.0°F | Resp 20 | Ht 62.0 in | Wt 83.0 lb

## 2021-12-19 DIAGNOSIS — I70219 Atherosclerosis of native arteries of extremities with intermittent claudication, unspecified extremity: Secondary | ICD-10-CM | POA: Diagnosis not present

## 2021-12-19 NOTE — H&P (View-Only) (Signed)
ASSESSMENT & PLAN   MULTILEVEL PERIPHERAL ARTERIAL DISEASE.  This patient has significant inflow disease bilaterally in addition to bilateral common femoral artery disease and infrainguinal arterial occlusive disease.  She has developed some rest pain on the left foot and has critical limb ischemia.  I think this could quickly become a limb threatening problem.  I have recommended we proceed with arteriography. I have reviewed with the patient the indications for arteriography. In addition, I have reviewed the potential complications of arteriography including but not limited to: Bleeding, arterial injury, arterial thrombosis, dye action, renal insufficiency, or other unpredictable medical problems. I have explained to the patient that if we find disease amenable to angioplasty we could potentially address this at the same time. I have discussed the potential complications of angioplasty and stenting, including but not limited to: Bleeding, arterial thrombosis, arterial injury, dissection, or the need for surgical intervention.  I will likely get an arch aortogram to evaluate her subclavian at least on the left is 1 potential option for revascularization would be axillobifemoral bypass grafting.  If we were to consider aortofemoral bypass grafting she would obviously need preoperative cardiac evaluation.  I am worried about her nutritional status which would put her at increased risk for this.  Alternatively, if we can address her inflow disease on the right she might be a candidate for bilateral femoral endarterectomies and a right to left femoral-femoral bypass.  Given that she does not have a wound I think if we can address her inflow disease we can hold off on infrainguinal bypass grafting.  I have also discussed with her the importance of tobacco cessation.  She is on aspirin and is on a statin.  SEVERE PROTEIN CALORIE MALNUTRITION: Her BMI is 15.  This certainly increases her operative  risk.  REASON FOR CONSULT:    Peripheral arterial disease.  The consult is requested by the emergency department.  HPI:   Rita Yang is a 71 y.o. female who was seen in the emergency department on 12/11/2021 with left leg pain.  She had been having pain over the last several months.  She denied any rest pain.  The patient underwent a CT angiogram which showed significant peripheral arterial disease.  She was to start aspirin and the statin was set up for an outpatient visit.  On my history the patient has a long history of left calf claudication.  This has been going on for several months and is gradually progressing.  She has had no acute onset of symptoms.  She also has some pain in her left knee but denies thigh or hip claudication.  She has mild symptoms on the right side.  She is also developed some rest pain in her right foot at night.  Her risk factors for peripheral arterial disease include hypertension and tobacco use.  She currently smokes a half a pack per day.  She tells me that she has been smoking since she was 16.  She denies any history of diabetes or hypercholesterolemia.  She denies any history of myocardial infarction or history of congestive heart failure.  She has had no recent chest pain.  She does admit to some mild dyspnea on exertion.  Past Medical History:  Diagnosis Date   Anxiety    Bronchitis     Family History  Problem Relation Age of Onset   Colon cancer Neg Hx    Colon polyps Neg Hx     SOCIAL HISTORY: Social History   Tobacco  Use   Smoking status: Every Day    Packs/day: 0.50    Types: Cigarettes   Smokeless tobacco: Not on file  Substance Use Topics   Alcohol use: No    Allergies  Allergen Reactions   Other Other (See Comments)    Flu shot. "hot"    Current Outpatient Medications  Medication Sig Dispense Refill   albuterol (PROVENTIL HFA;VENTOLIN HFA) 108 (90 Base) MCG/ACT inhaler Inhale 1-2 puffs into the lungs every 6 (six) hours as  needed for wheezing or shortness of breath.     aspirin 81 MG chewable tablet Chew 1 tablet (81 mg total) by mouth daily. 30 tablet 0   chlorthalidone (HYGROTON) 25 MG tablet Take 25 mg by mouth every morning.     diazepam (VALIUM) 5 MG tablet Take 5 mg by mouth every 8 (eight) hours as needed for anxiety.     lisinopril (ZESTRIL) 5 MG tablet Take 5 mg by mouth daily.     nicotine (NICODERM CQ) 21 mg/24hr patch Place 1 patch (21 mg total) onto the skin daily. 28 patch 0   rosuvastatin (CRESTOR) 20 MG tablet Take 1 tablet (20 mg total) by mouth daily. 30 tablet 0   cyclobenzaprine (FLEXERIL) 5 MG tablet Take 1 tablet (5 mg total) by mouth 3 (three) times daily as needed. (Patient not taking: Reported on 12/11/2021) 30 tablet 0   ibuprofen (ADVIL,MOTRIN) 800 MG tablet Take 1 tablet (800 mg total) by mouth 3 (three) times daily. (Patient not taking: Reported on 12/11/2021) 21 tablet 0   naproxen (NAPROSYN) 250 MG tablet Take 1 po BID with food prn pain (Patient not taking: Reported on 12/11/2021) 30 tablet 0   omeprazole (PRILOSEC) 20 MG capsule Take 1 capsule (20 mg total) by mouth daily. (Patient not taking: Reported on 12/11/2021) 90 capsule 3   No current facility-administered medications for this visit.    REVIEW OF SYSTEMS:  [X]  denotes positive finding, [ ]  denotes negative finding Cardiac  Comments:  Chest pain or chest pressure:    Shortness of breath upon exertion: x   Short of breath when lying flat:    Irregular heart rhythm:        Vascular    Pain in calf, thigh, or hip brought on by ambulation: x   Pain in feet at night that wakes you up from your sleep:  x   Blood clot in your veins:    Leg swelling:         Pulmonary    Oxygen at home:    Productive cough:     Wheezing:         Neurologic    Sudden weakness in arms or legs:     Sudden numbness in arms or legs:     Sudden onset of difficulty speaking or slurred speech:    Temporary loss of vision in one eye:      Problems with dizziness:         Gastrointestinal    Blood in stool:     Vomited blood:         Genitourinary    Burning when urinating:     Blood in urine:        Psychiatric    Major depression:         Hematologic    Bleeding problems:    Problems with blood clotting too easily:        Skin    Rashes or ulcers:  Constitutional    Fever or chills:    -  PHYSICAL EXAM:   Vitals:   12/19/21 1108  BP: (!) 145/88  Pulse: 87  Resp: 20  Temp: 98 F (36.7 C)  SpO2: 95%  Weight: 83 lb (37.6 kg)  Height: 5\' 2"  (1.575 m)   Body mass index is 15.18 kg/m. GENERAL: The patient is a well-nourished female, in no acute distress. The vital signs are documented above. CARDIAC: There is a regular rate and rhythm.  VASCULAR: I do not detect carotid bruits. On the right side she has diminished but palpable femoral pulse.  I cannot palpate popliteal or pedal pulses. On the left side, I cannot palpate a femoral, popliteal, or pedal pulses. PULMONARY: There is good air exchange bilaterally without wheezing or rales. ABDOMEN: Soft and non-tender with normal pitched bowel sounds.  MUSCULOSKELETAL: There are no major deformities. NEUROLOGIC: No focal weakness or paresthesias are detected. SKIN: There are no ulcers or rashes noted. PSYCHIATRIC: The patient has a normal affect.  DATA:    VENOUS DUPLEX: I reviewed the venous duplex scan that was done on 12/10/2021.  This showed no evidence of DVT in the left lower extremity.  There was noted incidentally to be occlusion of the left femoral artery with monophasic signals distally.  CT ANGIOGRAM: I reviewed the images of the CT angiogram that was done on 12/11/2021.  She was noted to have severe calcific disease of her aorta and iliac arteries with inflow disease bilaterally.  This was more significant on the left side.  Both superficial femoral arteries were occluded.  She has significant infrainguinal arterial occlusive disease  bilaterally.  In addition she had a 75% stenosis at the origin of the right renal artery and also a high-grade stenosis at the origin of the IMA.  LABS: I reviewed the labs from 12/10/2021.  GFR was 52.  Creatinine was 1.1.  Waverly Ferrarihristopher Latyra Jaye [facility]

## 2021-12-19 NOTE — Progress Notes (Addendum)
ASSESSMENT & PLAN   MULTILEVEL PERIPHERAL ARTERIAL DISEASE.  This patient has significant inflow disease bilaterally in addition to bilateral common femoral artery disease and infrainguinal arterial occlusive disease.  She has developed some rest pain on the left foot and has critical limb ischemia.  I think this could quickly become a limb threatening problem.  I have recommended we proceed with arteriography. I have reviewed with the patient the indications for arteriography. In addition, I have reviewed the potential complications of arteriography including but not limited to: Bleeding, arterial injury, arterial thrombosis, dye action, renal insufficiency, or other unpredictable medical problems. I have explained to the patient that if we find disease amenable to angioplasty we could potentially address this at the same time. I have discussed the potential complications of angioplasty and stenting, including but not limited to: Bleeding, arterial thrombosis, arterial injury, dissection, or the need for surgical intervention.  I will likely get an arch aortogram to evaluate her subclavian at least on the left is 1 potential option for revascularization would be axillobifemoral bypass grafting.  If we were to consider aortofemoral bypass grafting she would obviously need preoperative cardiac evaluation.  I am worried about her nutritional status which would put her at increased risk for this.  Alternatively, if we can address her inflow disease on the right she might be a candidate for bilateral femoral endarterectomies and a right to left femoral-femoral bypass.  Given that she does not have a wound I think if we can address her inflow disease we can hold off on infrainguinal bypass grafting.  I have also discussed with her the importance of tobacco cessation.  She is on aspirin and is on a statin.  SEVERE PROTEIN CALORIE MALNUTRITION: Her BMI is 15.  This certainly increases her operative  risk.  REASON FOR CONSULT:    Peripheral arterial disease.  The consult is requested by the emergency department.  HPI:   Rita Yang is a 71 y.o. female who was seen in the emergency department on 12/11/2021 with left leg pain.  She had been having pain over the last several months.  She denied any rest pain.  The patient underwent a CT angiogram which showed significant peripheral arterial disease.  She was to start aspirin and the statin was set up for an outpatient visit.  On my history the patient has a long history of left calf claudication.  This has been going on for several months and is gradually progressing.  She has had no acute onset of symptoms.  She also has some pain in her left knee but denies thigh or hip claudication.  She has mild symptoms on the right side.  She is also developed some rest pain in her right foot at night.  Her risk factors for peripheral arterial disease include hypertension and tobacco use.  She currently smokes a half a pack per day.  She tells me that she has been smoking since she was 16.  She denies any history of diabetes or hypercholesterolemia.  She denies any history of myocardial infarction or history of congestive heart failure.  She has had no recent chest pain.  She does admit to some mild dyspnea on exertion.  Past Medical History:  Diagnosis Date   Anxiety    Bronchitis     Family History  Problem Relation Age of Onset   Colon cancer Neg Hx    Colon polyps Neg Hx     SOCIAL HISTORY: Social History   Tobacco  Use   Smoking status: Every Day    Packs/day: 0.50    Types: Cigarettes   Smokeless tobacco: Not on file  Substance Use Topics   Alcohol use: No    Allergies  Allergen Reactions   Other Other (See Comments)    Flu shot. "hot"    Current Outpatient Medications  Medication Sig Dispense Refill   albuterol (PROVENTIL HFA;VENTOLIN HFA) 108 (90 Base) MCG/ACT inhaler Inhale 1-2 puffs into the lungs every 6 (six) hours as  needed for wheezing or shortness of breath.     aspirin 81 MG chewable tablet Chew 1 tablet (81 mg total) by mouth daily. 30 tablet 0   chlorthalidone (HYGROTON) 25 MG tablet Take 25 mg by mouth every morning.     diazepam (VALIUM) 5 MG tablet Take 5 mg by mouth every 8 (eight) hours as needed for anxiety.     lisinopril (ZESTRIL) 5 MG tablet Take 5 mg by mouth daily.     nicotine (NICODERM CQ) 21 mg/24hr patch Place 1 patch (21 mg total) onto the skin daily. 28 patch 0   rosuvastatin (CRESTOR) 20 MG tablet Take 1 tablet (20 mg total) by mouth daily. 30 tablet 0   cyclobenzaprine (FLEXERIL) 5 MG tablet Take 1 tablet (5 mg total) by mouth 3 (three) times daily as needed. (Patient not taking: Reported on 12/11/2021) 30 tablet 0   ibuprofen (ADVIL,MOTRIN) 800 MG tablet Take 1 tablet (800 mg total) by mouth 3 (three) times daily. (Patient not taking: Reported on 12/11/2021) 21 tablet 0   naproxen (NAPROSYN) 250 MG tablet Take 1 po BID with food prn pain (Patient not taking: Reported on 12/11/2021) 30 tablet 0   omeprazole (PRILOSEC) 20 MG capsule Take 1 capsule (20 mg total) by mouth daily. (Patient not taking: Reported on 12/11/2021) 90 capsule 3   No current facility-administered medications for this visit.    REVIEW OF SYSTEMS:  [X]  denotes positive finding, [ ]  denotes negative finding Cardiac  Comments:  Chest pain or chest pressure:    Shortness of breath upon exertion: x   Short of breath when lying flat:    Irregular heart rhythm:        Vascular    Pain in calf, thigh, or hip brought on by ambulation: x   Pain in feet at night that wakes you up from your sleep:  x   Blood clot in your veins:    Leg swelling:         Pulmonary    Oxygen at home:    Productive cough:     Wheezing:         Neurologic    Sudden weakness in arms or legs:     Sudden numbness in arms or legs:     Sudden onset of difficulty speaking or slurred speech:    Temporary loss of vision in one eye:      Problems with dizziness:         Gastrointestinal    Blood in stool:     Vomited blood:         Genitourinary    Burning when urinating:     Blood in urine:        Psychiatric    Major depression:         Hematologic    Bleeding problems:    Problems with blood clotting too easily:        Skin    Rashes or ulcers:  Constitutional    Fever or chills:    -  PHYSICAL EXAM:   Vitals:   12/19/21 1108  BP: (!) 145/88  Pulse: 87  Resp: 20  Temp: 98 F (36.7 C)  SpO2: 95%  Weight: 83 lb (37.6 kg)  Height: 5\' 2"  (1.575 m)   Body mass index is 15.18 kg/m. GENERAL: The patient is a poorly nourished female, in no acute distress. The vital signs are documented above. CARDIAC: There is a regular rate and rhythm.  VASCULAR: I do not detect carotid bruits. On the right side she has diminished but palpable femoral pulse.  I cannot palpate popliteal or pedal pulses. On the left side, I cannot palpate a femoral, popliteal, or pedal pulses. PULMONARY: There is good air exchange bilaterally without wheezing or rales. ABDOMEN: Soft and non-tender with normal pitched bowel sounds.  MUSCULOSKELETAL: There are no major deformities. NEUROLOGIC: No focal weakness or paresthesias are detected. SKIN: There are no ulcers or rashes noted. PSYCHIATRIC: The patient has a normal affect.  DATA:    VENOUS DUPLEX: I reviewed the venous duplex scan that was done on 12/10/2021.  This showed no evidence of DVT in the left lower extremity.  There was noted incidentally to be occlusion of the left femoral artery with monophasic signals distally.  CT ANGIOGRAM: I reviewed the images of the CT angiogram that was done on 12/11/2021.  She was noted to have severe calcific disease of her aorta and iliac arteries with inflow disease bilaterally.  This was more significant on the left side.  Both superficial femoral arteries were occluded.  She has significant infrainguinal arterial occlusive disease  bilaterally.  In addition she had a 75% stenosis at the origin of the right renal artery and also a high-grade stenosis at the origin of the IMA.  LABS: I reviewed the labs from 12/10/2021.  GFR was 52.  Creatinine was 1.1.  12/12/2021 [facility]

## 2021-12-27 ENCOUNTER — Other Ambulatory Visit: Payer: Self-pay

## 2021-12-27 ENCOUNTER — Encounter (HOSPITAL_COMMUNITY)
Admission: RE | Disposition: A | Discharge status: Home / Self Care | Payer: Self-pay | Source: Home / Self Care | Attending: Vascular Surgery

## 2021-12-27 ENCOUNTER — Ambulatory Visit (HOSPITAL_COMMUNITY)
Admission: RE | Admit: 2021-12-27 | Discharge: 2021-12-27 | Disposition: A | Discharge status: Home / Self Care | Payer: Medicare HMO | Attending: Vascular Surgery | Admitting: Vascular Surgery

## 2021-12-27 DIAGNOSIS — I10 Essential (primary) hypertension: Secondary | ICD-10-CM | POA: Diagnosis not present

## 2021-12-27 DIAGNOSIS — Z7982 Long term (current) use of aspirin: Secondary | ICD-10-CM | POA: Insufficient documentation

## 2021-12-27 DIAGNOSIS — Z681 Body mass index (BMI) 19 or less, adult: Secondary | ICD-10-CM | POA: Insufficient documentation

## 2021-12-27 DIAGNOSIS — R0609 Other forms of dyspnea: Secondary | ICD-10-CM | POA: Diagnosis not present

## 2021-12-27 DIAGNOSIS — F17210 Nicotine dependence, cigarettes, uncomplicated: Secondary | ICD-10-CM | POA: Diagnosis not present

## 2021-12-27 DIAGNOSIS — I70222 Atherosclerosis of native arteries of extremities with rest pain, left leg: Secondary | ICD-10-CM | POA: Insufficient documentation

## 2021-12-27 DIAGNOSIS — I70219 Atherosclerosis of native arteries of extremities with intermittent claudication, unspecified extremity: Secondary | ICD-10-CM

## 2021-12-27 DIAGNOSIS — I708 Atherosclerosis of other arteries: Secondary | ICD-10-CM | POA: Insufficient documentation

## 2021-12-27 DIAGNOSIS — E43 Unspecified severe protein-calorie malnutrition: Secondary | ICD-10-CM | POA: Diagnosis not present

## 2021-12-27 HISTORY — PX: AORTIC ARCH ANGIOGRAPHY: CATH118224

## 2021-12-27 HISTORY — PX: PERIPHERAL VASCULAR INTERVENTION: CATH118257

## 2021-12-27 HISTORY — PX: ABDOMINAL AORTOGRAM W/LOWER EXTREMITY: CATH118223

## 2021-12-27 LAB — POCT I-STAT, CHEM 8
BUN: 21 mg/dL (ref 8–23)
Calcium, Ion: 1.19 mmol/L (ref 1.15–1.40)
Chloride: 100 mmol/L (ref 98–111)
Creatinine, Ser: 1.1 mg/dL — ABNORMAL HIGH (ref 0.44–1.00)
Glucose, Bld: 78 mg/dL (ref 70–99)
HCT: 39 % (ref 36.0–46.0)
Hemoglobin: 13.3 g/dL (ref 12.0–15.0)
Potassium: 4.2 mmol/L (ref 3.5–5.1)
Sodium: 142 mmol/L (ref 135–145)
TCO2: 31 mmol/L (ref 22–32)

## 2021-12-27 LAB — POCT ACTIVATED CLOTTING TIME
Activated Clotting Time: 215 seconds
Activated Clotting Time: 263 seconds
Activated Clotting Time: 179 seconds

## 2021-12-27 SURGERY — AORTIC ARCH ANGIOGRAPHY
Anesthesia: LOCAL

## 2021-12-27 MED ORDER — CLOPIDOGREL BISULFATE 75 MG PO TABS: 75.0000 mg | ORAL_TABLET | Freq: Every day | ORAL | 11 refills | Status: DC

## 2021-12-27 MED ORDER — SODIUM CHLORIDE 0.9 % WEIGHT BASED INFUSION
1.0000 mL/kg/h | INTRAVENOUS | Status: DC
Start: 2021-12-27 — End: 2021-12-27

## 2021-12-27 MED ORDER — CLOPIDOGREL BISULFATE 75 MG PO TABS: 75.0000 mg | ORAL_TABLET | Freq: Every day | ORAL | Status: DC

## 2021-12-27 MED ORDER — SODIUM CHLORIDE 0.9 % IV SOLN
250.0000 mL | INTRAVENOUS | Status: DC | PRN
Start: 2021-12-27 — End: 2021-12-27

## 2021-12-27 MED ORDER — HYDRALAZINE HCL 20 MG/ML IJ SOLN: 5.0000 mg | INTRAMUSCULAR | Status: DC | PRN

## 2021-12-27 MED ORDER — LIDOCAINE HCL (PF) 1 % IJ SOLN
INTRAMUSCULAR | Status: DC | PRN
  Administered 2021-12-27: 20 mL via INTRADERMAL

## 2021-12-27 MED ORDER — LIDOCAINE HCL (PF) 1 % IJ SOLN
INTRAMUSCULAR | Status: AC
  Filled 2021-12-27: qty 30

## 2021-12-27 MED ORDER — SODIUM CHLORIDE 0.9 % IV SOLN: INTRAVENOUS | Status: DC

## 2021-12-27 MED ORDER — HEPARIN (PORCINE) IN NACL 1000-0.9 UT/500ML-% IV SOLN
INTRAVENOUS | Status: DC | PRN
  Administered 2021-12-27 (×2): 500 mL

## 2021-12-27 MED ORDER — MIDAZOLAM HCL 2 MG/2ML IJ SOLN
INTRAMUSCULAR | Status: AC
  Filled 2021-12-27: qty 2

## 2021-12-27 MED ORDER — CLOPIDOGREL BISULFATE 300 MG PO TABS
ORAL_TABLET | ORAL | Status: AC
  Filled 2021-12-27: qty 1

## 2021-12-27 MED ORDER — FENTANYL CITRATE (PF) 100 MCG/2ML IJ SOLN
INTRAMUSCULAR | Status: AC
  Filled 2021-12-27: qty 2

## 2021-12-27 MED ORDER — HYDRALAZINE HCL 20 MG/ML IJ SOLN
INTRAMUSCULAR | Status: AC
  Filled 2021-12-27: qty 1

## 2021-12-27 MED ORDER — CLOPIDOGREL BISULFATE 300 MG PO TABS
ORAL_TABLET | ORAL | Status: DC | PRN
  Administered 2021-12-27: 300 mg via ORAL

## 2021-12-27 MED ORDER — FENTANYL CITRATE (PF) 100 MCG/2ML IJ SOLN
INTRAMUSCULAR | Status: DC | PRN
  Administered 2021-12-27: 50 ug via INTRAVENOUS

## 2021-12-27 MED ORDER — HEPARIN SODIUM (PORCINE) 1000 UNIT/ML IJ SOLN
INTRAMUSCULAR | Status: DC | PRN
  Administered 2021-12-27: 4000 [IU] via INTRAVENOUS

## 2021-12-27 MED ORDER — IODIXANOL 320 MG/ML IV SOLN
INTRAVENOUS | Status: DC | PRN
  Administered 2021-12-27: 215 mL

## 2021-12-27 MED ORDER — ACETAMINOPHEN 325 MG PO TABS: 650.0000 mg | ORAL_TABLET | ORAL | Status: DC | PRN

## 2021-12-27 MED ORDER — HEPARIN (PORCINE) IN NACL 1000-0.9 UT/500ML-% IV SOLN
INTRAVENOUS | Status: AC
  Filled 2021-12-27: qty 1000

## 2021-12-27 MED ORDER — ONDANSETRON HCL 4 MG/2ML IJ SOLN: 4.0000 mg | Freq: Four times a day (QID) | INTRAMUSCULAR | Status: DC | PRN

## 2021-12-27 MED ORDER — SODIUM CHLORIDE 0.9% FLUSH: 3.0000 mL | Freq: Two times a day (BID) | INTRAVENOUS | Status: DC

## 2021-12-27 MED ORDER — HEPARIN SODIUM (PORCINE) 1000 UNIT/ML IJ SOLN
INTRAMUSCULAR | Status: AC
  Filled 2021-12-27: qty 10

## 2021-12-27 MED ORDER — SODIUM CHLORIDE 0.9% FLUSH
3.0000 mL | INTRAVENOUS | Status: DC | PRN
Start: 2021-12-27 — End: 2021-12-27

## 2021-12-27 MED ORDER — LABETALOL HCL 5 MG/ML IV SOLN: 10.0000 mg | INTRAVENOUS | Status: DC | PRN

## 2021-12-27 MED ORDER — HYDRALAZINE HCL 20 MG/ML IJ SOLN
INTRAMUSCULAR | Status: DC | PRN
  Administered 2021-12-27: 10 mg via INTRAVENOUS

## 2021-12-27 MED ORDER — MIDAZOLAM HCL 2 MG/2ML IJ SOLN
INTRAMUSCULAR | Status: DC | PRN
  Administered 2021-12-27: 1 mg via INTRAVENOUS

## 2021-12-27 SURGICAL SUPPLY — 19 items
BALLN MUSTANG 4X60X75 (BALLOONS) ×4
BALLOON MUSTANG 4X60X75 (BALLOONS) IMPLANT
CATH ANGIO 5F PIGTAIL 100CM (CATHETERS) ×1 IMPLANT
CATH CROSS OVER TEMPO 5F (CATHETERS) ×1 IMPLANT
CATH STRAIGHT 5FR 65CM (CATHETERS) ×1 IMPLANT
GUIDEWIRE ANGLED .035X150CM (WIRE) ×1 IMPLANT
KIT ENCORE 26 ADVANTAGE (KITS) ×1 IMPLANT
KIT MICROPUNCTURE NIT STIFF (SHEATH) ×1 IMPLANT
KIT PV (KITS) ×5 IMPLANT
SHEATH PINNACLE 5F 10CM (SHEATH) ×1 IMPLANT
SHEATH PINNACLE ST 6F 45CM (SHEATH) ×1 IMPLANT
SHEATH PROBE COVER 6X72 (BAG) ×1 IMPLANT
STENT INNOVA 5X60X130 (Permanent Stent) ×1 IMPLANT
STOPCOCK MORSE 400PSI 3WAY (MISCELLANEOUS) ×1 IMPLANT
SYR MEDRAD MARK V 150ML (SYRINGE) ×1 IMPLANT
TRANSDUCER W/STOPCOCK (MISCELLANEOUS) ×5 IMPLANT
TRAY PV CATH (CUSTOM PROCEDURE TRAY) ×5 IMPLANT
WIRE HITORQ VERSACORE ST 145CM (WIRE) ×1 IMPLANT
WIRE ROSEN-J .035X260CM (WIRE) ×1 IMPLANT

## 2021-12-27 NOTE — Op Note (Signed)
PATIENT: Rita Yang      MRN: [id number] DOB: [birth year]    DATE OF PROCEDURE: 12/27/2021  INDICATIONS:    Larua Collier is a 71 y.o. female who presents with multilevel arterial occlusive disease and rest pain in the left foot.  She presents for arteriography and possible intervention.  PROCEDURE:    Conscious sedation Ultrasound-guided access to the right common femoral artery Arch aortogram Aortogram with bilateral iliac arteriogram and bilateral lower extremity runoff Angioplasty and stenting of the left external iliac artery (5 x 60 Innova self-expanding stent-4 x 60 postdilatation) Retrograde right femoral arteriogram  SURGEON: Judeth Cornfield. Scot Dock, MD, FACS  ANESTHESIA: Local with sedation  EBL: Minimal  TECHNIQUE: The patient was brought to the peripheral vascular lab and was sedated. The period of conscious sedation was 74 minutes.  During that time period, I was present face-to-face 100% of the time.  The patient was administered 1 mg of Versed and 50 mcg of fentanyl. The patient's heart rate, blood pressure, and oxygen saturation were monitored by the nurse continuously during the procedure.  Both groins were prepped and draped in the usual sterile fashion.  Under ultrasound guidance, after the skin was anesthetized, I cannulated the right common femoral artery with a micropuncture needle and a micropuncture sheath was introduced over a wire.  This was exchanged for a 5 [place] sheath over a Bentson wire.  By ultrasound the femoral artery was patent. A real-time image was obtained and sent to the server.  A pigtail was positioned in the ascending aorta and arch aortogram was obtained and a 40 degree LAO projection.  This was obtained to evaluate the left subclavian artery in case the patient needed a left axillofemoral bypass graft.  The catheter was in position at the L1 vertebral body and flush aortogram obtained.  Next the catheter was positioned above the aortic bifurcation and  an oblique iliac projections were obtained.  There was a 90% left external iliac artery stenosis which was fairly focal.  I elected to address this with angioplasty and stenting.  The pigtail catheter was exchanged for a crossover catheter and I was able to get a Glidewire past the stenosis into the common femoral artery.  I then exchanged for a Rosen wire over a catheter.  Next the 5 French sheath on the right was exchanged for a 6 [place] destination sheath.  The patient was heparinized and ACT was monitored throughout the procedure.  An iliac arteriogram was obtained and I selected a 5 mm x 60 mm Innova self-expanding stent.  This was positioned across the stenosis up to the level of the hypogastric artery as there was moderate disease above the foot tight focal stenosis.  I stayed above the inguinal ligament.  The stent was deployed without difficulty.  Postdilatation was done with a 4 mm x 60 mm balloon inflated to 18 atm for 1 minute.  Completion films showed no residual stenosis.  I then retracted the sheath and advanced the pigtail catheter to the bifurcation.  Bilateral lower extremity runoff films were obtained.  Next to obtain better visualization on the right the sheath was retracted and a right femoral arteriogram was obtained with right lower extremity runoff.  Patient was transferred to the holding area for removal of the sheath.  No immediate complications were noted.  FINDINGS:   The origin of the renal arteries were not well visualized.  The infrarenal aorta had diffuse calcific disease but no focal stenosis. On the right side,  the common iliac, external iliac and hypogastric arteries are patent with mild diffuse calcific disease.  There is significant plaque within the common femoral artery.  The deep femoral artery is patent.  The superficial femoral artery is occluded proximally with reconstitution of the above-knee popliteal artery which is somewhat small.  There is two-vessel runoff  on the right via the anterior tibial and peroneal arteries although there is poor visualization distally.  The posterior tibial artery is occluded on the right. On the left side the common iliac artery is patent.  There is some ectasia distally.  The hypogastric artery is patent.  There is diffuse disease throughout the external iliac artery with a tight 90% stenosis in the external iliac artery distally.  This was successfully addressed with angioplasty and stenting as described above.  Below that the common femoral and deep femoral artery are patent.  The superficial femoral artery is occluded at its origin with reconstitution of the above-knee popliteal artery.  There is mild disease behind the knee.  There is two-vessel runoff on the left via the anterior tibial and peroneal arteries.  The posterior tibial artery is occluded.  CLINICAL NOTE: I have started her on Plavix.  She was already on aspirin and a statin.  I think addressing the inflow should help address her rest pain.  We would only consider infrainguinal bypass if she had limb threatening ischemia.  Likewise on the right she would require femoral endarterectomy and a femoropopliteal bypass if her symptoms progress significantly.    TASC Classification  Largest Sheath Size: 6 [place]  Target vessel: Left external iliac artery  % Stenosis: Pre90%. Post 0%.  Lesion length: 5 cm  Calcification: Yes  Most impactful devices used (Up to 3): In Novo 5 x 60 self-expanding stent, postdilatation 4 x 60 balloon  Outflow: Disease present or not distal to the lesion treated and the  Flow in the distal vessel: Yes  Deitra Mayo, MD, FACS [facility] [place] DICTATION:   12/27/2021

## 2021-12-27 NOTE — Interval H&P Note (Signed)
History and Physical Interval Note:  12/27/2021 8:46 AM  Rita Yang  has presented today for surgery, with the diagnosis of PAD.  The various methods of treatment have been discussed with the patient and family. After consideration of risks, benefits and other options for treatment, the patient has consented to  Procedure(s): AORTIC ARCH ANGIOGRAPHY (N/A) as a surgical intervention.  The patient's history has been reviewed, patient examined, no change in status, stable for surgery.  I have reviewed the patient's chart and labs.  Questions were answered to the patient's satisfaction.     Waverly Ferrari

## 2021-12-30 ENCOUNTER — Encounter (HOSPITAL_COMMUNITY): Payer: Self-pay | Admitting: Vascular Surgery

## 2021-12-30 ENCOUNTER — Telehealth: Payer: Self-pay

## 2021-12-30 NOTE — Telephone Encounter (Signed)
Pt called stating that she had surgery on 6/16 and wanted to know when she was able to drive.  Spoke with Dr Lenell Antu who advised that the pt should be fine to drive now. Returned pt's call, two identifiers used. Pt is only taking Tylenol for any pain. Pt denies severe pain or swelling at R groin site. Relayed Hawken's msg r/t driving. Pt confirmed understanding.

## 2022-01-05 ENCOUNTER — Other Ambulatory Visit: Payer: Self-pay

## 2022-01-05 DIAGNOSIS — I70219 Atherosclerosis of native arteries of extremities with intermittent claudication, unspecified extremity: Secondary | ICD-10-CM

## 2022-01-06 ENCOUNTER — Other Ambulatory Visit: Payer: Self-pay

## 2022-01-06 MED ORDER — ROSUVASTATIN CALCIUM 20 MG PO TABS: 20.0000 mg | ORAL_TABLET | Freq: Every day | ORAL | 0 refills | Status: DC

## 2022-01-28 ENCOUNTER — Other Ambulatory Visit: Payer: Self-pay | Admitting: Vascular Surgery

## 2022-02-03 ENCOUNTER — Other Ambulatory Visit: Payer: Self-pay | Admitting: Vascular Surgery

## 2022-02-06 ENCOUNTER — Encounter: Payer: Self-pay | Admitting: Vascular Surgery

## 2022-02-06 ENCOUNTER — Ambulatory Visit (HOSPITAL_COMMUNITY)
Admission: RE | Admit: 2022-02-06 | Discharge: 2022-02-06 | Disposition: A | Discharge status: Home / Self Care | Payer: Medicare HMO | Source: Ambulatory Visit | Attending: Vascular Surgery | Admitting: Vascular Surgery

## 2022-02-06 ENCOUNTER — Ambulatory Visit (INDEPENDENT_AMBULATORY_CARE_PROVIDER_SITE_OTHER): Payer: Medicare HMO | Admitting: Vascular Surgery

## 2022-02-06 VITALS — BP 153/90 | HR 62 | Temp 98.3°F | Resp 20 | Ht 62.0 in | Wt 81.5 lb

## 2022-02-06 DIAGNOSIS — I70229 Atherosclerosis of native arteries of extremities with rest pain, unspecified extremity: Secondary | ICD-10-CM

## 2022-02-06 DIAGNOSIS — I70219 Atherosclerosis of native arteries of extremities with intermittent claudication, unspecified extremity: Secondary | ICD-10-CM | POA: Diagnosis present

## 2022-02-06 NOTE — Progress Notes (Signed)
REASON FOR VISIT:   Follow-up after arteriogram  MEDICAL ISSUES:   MULTILEVEL ARTERIAL OCCLUSIVE DISEASE: This patient had presented with left pain in the left foot.  She was found to have a tight left external iliac artery stenosis which was successfully addressed with angioplasty and stenting.  By improving her inflow this has relieved her rest pain.  She does have infrainguinal arterial occlusive disease bilaterally.  Today have encouraged her to stay as active as possible.  We have also discussed the importance of tobacco cessation.  Hopefully we can avoid surgery as she would be at increased risk for surgery given her severe protein calorie malnutrition and small arteries.  I have ordered a duplex of her stent in 6 months and ABIs at that time.  She knows to call sooner if she has problems.  HPI:   Rita Yang is a pleasant 71 y.o. female who I had seen on 12/19/2021 with multilevel arterial disease.  She had inflow disease bilaterally in addition to disease in her common femoral arteries and also infrainguinal arterial occlusive disease.  She had developed rest pain in her left foot and critical limb ischemia.  I was concerned that this was going to become a limb threatening problem.  I recommended arteriography.  At that time she was also noted to have severe protein calorie malnutrition with a BMI of 15 which would put her at increased risk for any surgery.  We also discussed the importance of tobacco cessation.  On 12/27/2021 she underwent an aortogram with runoff and also arch aortogram.  She had a tight left external iliac artery stenosis which was successfully addressed with angioplasty and stenting.  She had been on aspirin and a statin and we added Plavix.  I felt that addressing her inflow disease could help her rest pain.  If her symptoms did not improve we could consider infrainguinal bypass for limb threatening ischemia.  If her symptoms worsen on the right side she would require a  right femoral endarterectomy and femoropopliteal bypass grafting.  Since I saw her last, she tells me that both legs feel better.  She is not having any rest pain.  She does have some calf claudication bilaterally.  She continues to smoke about a quarter of a pack per day.  She thinks that the cholesterol medication which was started in the emergency department is making her weak.  I have asked her to discuss this with her primary care physician.  Past Medical History:  Diagnosis Date   Anxiety    Bronchitis     Family History  Problem Relation Age of Onset   Colon cancer Neg Hx    Colon polyps Neg Hx     SOCIAL HISTORY: Social History   Tobacco Use   Smoking status: Every Day    Packs/day: 0.25    Types: Cigarettes   Smokeless tobacco: Not on file  Substance Use Topics   Alcohol use: No    Allergies  Allergen Reactions   Other Other (See Comments)    Flu shot. "hot"    Current Outpatient Medications  Medication Sig Dispense Refill   albuterol (PROVENTIL HFA;VENTOLIN HFA) 108 (90 Base) MCG/ACT inhaler Inhale 1-2 puffs into the lungs every 6 (six) hours as needed for wheezing or shortness of breath.     chlorthalidone (HYGROTON) 25 MG tablet Take 25 mg by mouth every morning.     clopidogrel (PLAVIX) 75 MG tablet Take 1 tablet (75 mg total) by mouth daily.  30 tablet 11   diazepam (VALIUM) 5 MG tablet Take 5 mg by mouth every 8 (eight) hours as needed for anxiety.     ibuprofen (ADVIL) 200 MG tablet Take 200 mg by mouth every 8 (eight) hours as needed for moderate pain.     nicotine (NICODERM CQ) 21 mg/24hr patch Place 1 patch (21 mg total) onto the skin daily. 28 patch 0   rosuvastatin (CRESTOR) 20 MG tablet Take 1 tablet by mouth once daily 30 tablet 0   No current facility-administered medications for this visit.    REVIEW OF SYSTEMS:  [X]  denotes positive finding, [ ]  denotes negative finding Cardiac  Comments:  Chest pain or chest pressure:    Shortness of breath  upon exertion:    Short of breath when lying flat:    Irregular heart rhythm:        Vascular    Pain in calf, thigh, or hip brought on by ambulation:    Pain in feet at night that wakes you up from your sleep:     Blood clot in your veins:    Leg swelling:         Pulmonary    Oxygen at home:    Productive cough:     Wheezing:         Neurologic    Sudden weakness in arms or legs:     Sudden numbness in arms or legs:     Sudden onset of difficulty speaking or slurred speech:    Temporary loss of vision in one eye:     Problems with dizziness:         Gastrointestinal    Blood in stool:     Vomited blood:         Genitourinary    Burning when urinating:     Blood in urine:        Psychiatric    Major depression:         Hematologic    Bleeding problems:    Problems with blood clotting too easily:        Skin    Rashes or ulcers:        Constitutional    Fever or chills:     PHYSICAL EXAM:   Vitals:   02/06/22 1124  BP: (!) 153/90  Pulse: 62  Resp: 20  Temp: 98.3 F (36.8 C)  SpO2: 98%  Weight: 81 lb 8 oz (37 kg)  Height: 5\' 2"  (1.575 m)   GENERAL: The patient is a well-nourished female, in no acute distress. The vital signs are documented above. CARDIAC: There is a regular rate and rhythm.  VASCULAR: I do not detect carotid bruits. She has palpable femoral pulses. I cannot palpate pedal pulses. PULMONARY: There is good air exchange bilaterally without wheezing or rales. ABDOMEN: Soft and non-tender with normal pitched bowel sounds.  MUSCULOSKELETAL: There are no major deformities or cyanosis. NEUROLOGIC: No focal weakness or paresthesias are detected. SKIN: There are no ulcers or rashes noted. PSYCHIATRIC: The patient has a normal affect.  DATA:    ARTERIAL DOPPLER STUDY: I have independently interpreted her arterial Doppler study today.  On the right side she has a monophasic dorsalis pedis and posterior tibial signal.  ABI is 50%.  Toe  pressure 69 mmHg.  On the left side she has a dorsalis pedis signal which is monophasic.  She does not have a posterior tibial signal.  Her ABIs 46%.  Toe pressure 61 mmHg.  ARTERIOGRAM: I reviewed the images of her arteriogram that were done on 12/27/2021.  She underwent arch aortogram, aortogram with bilateral lower extremity runoff, and angioplasty and stenting of the left external iliac artery.  This was done with a 5 mm x 60 mm Innova oh self-expanding stent.  Postdilatation was with a 4 mm x 60 mm balloon.  CT ANGIOGRAM: Her CT angiogram on 12/11/2021 showed severe calcific disease of her aorta and iliac arteries with inflow disease bilaterally.  This was more significant on the left side.  Both superficial femoral arteries were occluded.  Waverly Ferrari [facility] [phone]

## 2022-02-11 ENCOUNTER — Other Ambulatory Visit: Payer: Self-pay

## 2022-02-11 DIAGNOSIS — I70229 Atherosclerosis of native arteries of extremities with rest pain, unspecified extremity: Secondary | ICD-10-CM

## 2022-02-11 DIAGNOSIS — I70219 Atherosclerosis of native arteries of extremities with intermittent claudication, unspecified extremity: Secondary | ICD-10-CM

## 2022-10-23 ENCOUNTER — Ambulatory Visit (HOSPITAL_COMMUNITY): Payer: Medicare HMO

## 2022-10-23 ENCOUNTER — Ambulatory Visit: Payer: Medicare HMO | Admitting: Vascular Surgery

## 2022-11-27 ENCOUNTER — Ambulatory Visit (HOSPITAL_COMMUNITY)
Admission: RE | Admit: 2022-11-27 | Discharge: 2022-11-27 | Disposition: A | Discharge status: Home / Self Care | Payer: Medicare HMO | Source: Ambulatory Visit | Attending: Vascular Surgery | Admitting: Vascular Surgery

## 2022-11-27 ENCOUNTER — Encounter: Payer: Self-pay | Admitting: Vascular Surgery

## 2022-11-27 ENCOUNTER — Ambulatory Visit: Payer: Medicare HMO | Admitting: Vascular Surgery

## 2022-11-27 ENCOUNTER — Ambulatory Visit (INDEPENDENT_AMBULATORY_CARE_PROVIDER_SITE_OTHER)
Admission: RE | Admit: 2022-11-27 | Discharge: 2022-11-27 | Disposition: A | Discharge status: Home / Self Care | Payer: Medicare HMO | Source: Ambulatory Visit | Attending: Vascular Surgery | Admitting: Vascular Surgery

## 2022-11-27 VITALS — BP 159/72 | HR 55 | Temp 98.0°F | Resp 20 | Ht 62.0 in | Wt 86.0 lb

## 2022-11-27 DIAGNOSIS — I70229 Atherosclerosis of native arteries of extremities with rest pain, unspecified extremity: Secondary | ICD-10-CM | POA: Diagnosis not present

## 2022-11-27 DIAGNOSIS — I70219 Atherosclerosis of native arteries of extremities with intermittent claudication, unspecified extremity: Secondary | ICD-10-CM

## 2022-11-27 LAB — VAS US ABI WITH/WO TBI
Left ABI: 0.42
Right ABI: 0.37

## 2022-11-27 MED ORDER — CLOPIDOGREL BISULFATE 75 MG PO TABS: 75.0000 mg | ORAL_TABLET | Freq: Every day | ORAL | 2 refills | Status: DC

## 2022-11-27 NOTE — Progress Notes (Signed)
REASON FOR VISIT:   Follow-up of multilevel arterial occlusive disease.  MEDICAL ISSUES:   PERIPHERAL ARTERIAL DISEASE: This patient has infrainguinal arterial occlusive disease bilaterally.  She had her inflow disease on the left addressed with a left external iliac artery stent which is widely patent.  She has good femoral pulses.  She is down to 3 to 4 cigarettes a day.  I have encouraged her to try to get off the nicotine completely.  I encouraged her to stay as active as possible.  This despite her markedly reduced ABIs she currently is not having any significant claudication and denies any rest pain.  I suspect her activity is fairly limited.  I have encouraged her to stay as active as possible.  I have asked her to resume taking her aspirin.  I have sent a prescription for 3 months more of Plavix and then this can be discontinued.  She is on a statin.  I have ordered follow-up ABIs and a duplex of her iliac stent in 9 months.  Will have her seen by the PAs at that time as I have notified her that I will be retiring in September.  She knows to call sooner if she has problems.  HPI:   Rita Yang is a pleasant 72 y.o. female who I last saw on 02/06/2022.  She had presented with left foot pain and was found to have a tight left external iliac artery stenosis which was successfully addressed with angioplasty and stenting.  By improving her inflow this relieved her rest pain.  She has infrainguinal arterial occlusive disease bilaterally.  When I saw her last I encouraged her to stay as active as possible we discussed the importance of tobacco cessation.  I was hoping we could avoid surgery given that she had severe protein calorie malnutrition and very small arteries.  I set her up for a 51-month follow-up visit with a duplex of her stents and ABIs.  Since I saw her last, she denies any history of claudication, rest pain, or nonhealing ulcers.  I suspect her activity is fairly limited.  She is on  Plavix, and a statin.  For some reason she stopped taking her aspirin.  She smokes 3 to 4 cigarettes a day.  Past Medical History:  Diagnosis Date   Anxiety    Bronchitis     Family History  Problem Relation Age of Onset   Colon cancer Neg Hx    Colon polyps Neg Hx     SOCIAL HISTORY: Social History   Tobacco Use   Smoking status: Every Day    Packs/day: .25    Types: Cigarettes   Smokeless tobacco: Not on file  Substance Use Topics   Alcohol use: No    Allergies  Allergen Reactions   Other Other (See Comments)    Flu shot. "hot"    Current Outpatient Medications  Medication Sig Dispense Refill   albuterol (PROVENTIL HFA;VENTOLIN HFA) 108 (90 Base) MCG/ACT inhaler Inhale 1-2 puffs into the lungs every 6 (six) hours as needed for wheezing or shortness of breath.     celecoxib (CELEBREX) 50 MG capsule Take 50 mg by mouth daily.     clopidogrel (PLAVIX) 75 MG tablet Take 1 tablet (75 mg total) by mouth daily. 30 tablet 11   diazepam (VALIUM) 5 MG tablet Take 5 mg by mouth every 8 (eight) hours as needed for anxiety.     ibuprofen (ADVIL) 200 MG tablet Take 200 mg by mouth  every 8 (eight) hours as needed for moderate pain.     losartan (COZAAR) 25 MG tablet Take 25 mg by mouth daily.     rosuvastatin (CRESTOR) 20 MG tablet Take 1 tablet by mouth once daily 30 tablet 0   nicotine (NICODERM CQ) 21 mg/24hr patch Place 1 patch (21 mg total) onto the skin daily. (Patient not taking: Reported on 11/27/2022) 28 patch 0   No current facility-administered medications for this visit.    REVIEW OF SYSTEMS:  [X]  denotes positive finding, [ ]  denotes negative finding Cardiac  Comments:  Chest pain or chest pressure:    Shortness of breath upon exertion: x   Short of breath when lying flat:    Irregular heart rhythm:        Vascular    Pain in calf, thigh, or hip brought on by ambulation:    Pain in feet at night that wakes you up from your sleep:  x   Blood clot in your  veins:    Leg swelling:         Pulmonary    Oxygen at home:    Productive cough:     Wheezing:         Neurologic    Sudden weakness in arms or legs:     Sudden numbness in arms or legs:     Sudden onset of difficulty speaking or slurred speech:    Temporary loss of vision in one eye:     Problems with dizziness:         Gastrointestinal    Blood in stool:     Vomited blood:         Genitourinary    Burning when urinating:     Blood in urine:        Psychiatric    Major depression:         Hematologic    Bleeding problems:    Problems with blood clotting too easily:        Skin    Rashes or ulcers:        Constitutional    Fever or chills:     PHYSICAL EXAM:   Vitals:   11/27/22 0912  BP: (!) 159/72  Pulse: (!) 55  Resp: 20  Temp: 98 F (36.7 C)  SpO2: 95%  Weight: 86 lb (39 kg)  Height: 5\' 2"  (1.575 m)   GENERAL: The patient is a well-nourished female, in no acute distress. The vital signs are documented above. CARDIAC: There is a regular rate and rhythm.  VASCULAR: I do not detect carotid bruits. She has palpable femoral pulses. I cannot palpate pedal pulses. PULMONARY: There is good air exchange bilaterally without wheezing or rales. ABDOMEN: Soft and non-tender with normal pitched bowel sounds.  MUSCULOSKELETAL: There are no major deformities or cyanosis. NEUROLOGIC: No focal weakness or paresthesias are detected. SKIN: There are no ulcers or rashes noted. PSYCHIATRIC: The patient has a normal affect.  DATA:    ARTERIAL DOPPLER STUDY: I have independently interpreted her arterial Doppler study today.  On the right side there is a dorsalis pedis signal only which is monophasic.  ABIs 37%.  Toe pressure 66 mmHg.  On the left side there is a monophasic dorsalis pedis signal only.  ABIs 42%.  Toe pressure 67 mmHg.  ARTERIAL DUPLEX: I have independently interpreted the patient's duplex of her iliac stent on the left.  The stent in the left external  iliac artery is widely patent  with no areas of significant stenosis identified.    Waverly Ferrari [facility] [phone]

## 2022-12-04 ENCOUNTER — Other Ambulatory Visit: Payer: Self-pay

## 2022-12-04 DIAGNOSIS — I70219 Atherosclerosis of native arteries of extremities with intermittent claudication, unspecified extremity: Secondary | ICD-10-CM

## 2023-03-11 ENCOUNTER — Encounter (HOSPITAL_COMMUNITY): Payer: Self-pay | Admitting: Emergency Medicine

## 2023-03-11 ENCOUNTER — Other Ambulatory Visit: Payer: Self-pay

## 2023-03-11 ENCOUNTER — Emergency Department (HOSPITAL_COMMUNITY)
Admission: EM | Admit: 2023-03-11 | Discharge: 2023-03-11 | Disposition: A | Discharge status: Home / Self Care | Payer: Medicare HMO | Attending: Emergency Medicine | Admitting: Emergency Medicine

## 2023-03-11 DIAGNOSIS — U071 COVID-19: Secondary | ICD-10-CM | POA: Insufficient documentation

## 2023-03-11 DIAGNOSIS — R509 Fever, unspecified: Secondary | ICD-10-CM | POA: Diagnosis present

## 2023-03-11 DIAGNOSIS — Z7902 Long term (current) use of antithrombotics/antiplatelets: Secondary | ICD-10-CM | POA: Diagnosis not present

## 2023-03-11 LAB — SARS CORONAVIRUS 2 BY RT PCR: SARS Coronavirus 2 by RT PCR: POSITIVE — AB

## 2023-03-11 MED ORDER — PAXLOVID (150/100) 10 X 150 MG & 10 X 100MG PO TBPK: 2.0000 | ORAL_TABLET | Freq: Two times a day (BID) | ORAL | 0 refills | Status: AC

## 2023-03-11 MED ORDER — ACETAMINOPHEN 500 MG PO TABS
1000.0000 mg | ORAL_TABLET | Freq: Once | ORAL | Status: AC
Start: 2023-03-11 — End: 2023-03-11
  Administered 2023-03-11: 1000 mg via ORAL
  Filled 2023-03-11: qty 2

## 2023-03-11 NOTE — Discharge Instructions (Addendum)
Evaluation today revealed that you have COVID.  Your symptoms are consistent with this diagnosis.  I sent Paxlovid to your pharmacy.  Otherwise recommend conservative treatment at home with rest and hydration.  You can also take Tylenol and ibuprofen for fever and symptomatic relief.  If you have worsening shortness of breath, chest pain or any other concerning symptom please return emergency department further evaluation.  Otherwise recommend follow-up with PCP.

## 2023-03-11 NOTE — ED Triage Notes (Signed)
Cough and fever x 3 days. Temp 100.6. pt family member has COVID.

## 2023-03-11 NOTE — ED Provider Notes (Signed)
[place] EMERGENCY DEPARTMENT AT [facility] Provider Note   CSN: [id number] Arrival date & time: 03/11/23  1654     History  Chief Complaint  Patient presents with   Cough   Fever   HPI Rita Yang is a 72 y.o. female presenting for cough and fever.  Started 3 days ago.  Also mention that a family member had COVID as well.  At this time denies shortness of breath and chest pain.   Cough Associated symptoms: fever   Fever Associated symptoms: cough        Home Medications Prior to Admission medications   Medication Sig Start Date End Date Taking? Authorizing Provider  nirmatrelvir & ritonavir (PAXLOVID, 150/100,) 10 x 150 MG & 10 x 100MG  TBPK Take 2 tablets by mouth 2 (two) times daily for 5 days. 03/11/23 03/16/23 Yes Gareth Eagle, PA-C  albuterol (PROVENTIL HFA;VENTOLIN HFA) 108 (90 Base) MCG/ACT inhaler Inhale 1-2 puffs into the lungs every 6 (six) hours as needed for wheezing or shortness of breath.    [provider]  celecoxib (CELEBREX) 50 MG capsule Take 50 mg by mouth daily. 11/04/22   [provider]  clopidogrel (PLAVIX) 75 MG tablet Take 1 tablet (75 mg total) by mouth daily. 12/27/21   Chuck Hint, MD  clopidogrel (PLAVIX) 75 MG tablet Take 1 tablet (75 mg total) by mouth daily. 11/27/22   Chuck Hint, MD  diazepam (VALIUM) 5 MG tablet Take 5 mg by mouth every 8 (eight) hours as needed for anxiety. 05/01/15   [provider]  ibuprofen (ADVIL) 200 MG tablet Take 200 mg by mouth every 8 (eight) hours as needed for moderate pain.    [provider]  losartan (COZAAR) 25 MG tablet Take 25 mg by mouth daily. 11/04/22   [provider]  nicotine (NICODERM CQ) 21 mg/24hr patch Place 1 patch (21 mg total) onto the skin daily. Patient not taking: Reported on 11/27/2022 12/11/21   Maia Plan, MD  rosuvastatin (CRESTOR) 20 MG tablet Take 1 tablet by mouth once daily 01/28/22   Maeola Harman, MD      Allergies    Other    Review of Systems   Review of Systems  Constitutional:  Positive for fever.  Respiratory:  Positive for cough.     Physical Exam Updated Vital Signs BP (!) 146/94 (BP Location: Right Arm)   Pulse 78   Temp 99.8 F (37.7 C) (Oral)   Resp 16   Ht 5\' 2"  (1.575 m)   Wt 40.8 kg   SpO2 99%   BMI 16.46 kg/m  Physical Exam Vitals and nursing note reviewed.  HENT:     Head: Normocephalic and atraumatic.     Mouth/Throat:     Mouth: Mucous membranes are moist.  Eyes:     General:        Right eye: No discharge.        Left eye: No discharge.     Conjunctiva/sclera: Conjunctivae normal.  Cardiovascular:     Rate and Rhythm: Normal rate and regular rhythm.     Pulses: Normal pulses.     Heart sounds: Normal heart sounds.  Pulmonary:     Effort: Pulmonary effort is normal.     Breath sounds: Normal breath sounds. No decreased breath sounds, wheezing, rhonchi or rales.  Abdominal:     General: Abdomen is flat.     Palpations: Abdomen is soft.  Skin:  General: Skin is warm and dry.  Neurological:     General: No focal deficit present.  Psychiatric:        Mood and Affect: Mood normal.     ED Results / Procedures / Treatments   Labs (all labs ordered are listed, but only abnormal results are displayed) Labs Reviewed  SARS CORONAVIRUS 2 BY RT PCR - Abnormal; Notable for the following components:      Result Value   SARS Coronavirus 2 by RT PCR POSITIVE (*)    All other components within normal limits    EKG None  Radiology No results found.  Procedures Procedures    Medications Ordered in ED Medications  acetaminophen (TYLENOL) tablet 1,000 mg (has no administration in time range)    ED Course/ Medical Decision Making/ A&P                                 Medical Decision Making Risk OTC drugs.   72 year old well-appearing female presenting for cough and fever.  Exam was unremarkable.  Respiratory PCR  was positive for COVID.  Symptoms are consistent with this as the diagnosis.  Treated with Tylenol.  Sent renally adjusted Paxlovid to her pharmacy.  Discussed per return precautions.  Advise follow-up with PCP.  Vital stable.  Discharged home in good condition.        Final Clinical Impression(s) / ED Diagnoses Final diagnoses:  COVID    Rx / DC Orders ED Discharge Orders          Ordered    nirmatrelvir & ritonavir (PAXLOVID, 150/100,) 10 x 150 MG & 10 x 100MG  TBPK  2 times daily        03/11/23 2009              Vaughan Browner 03/11/23 2011    Benjiman Core, MD 03/12/23 0005

## 2023-03-17 ENCOUNTER — Ambulatory Visit: Payer: Medicare HMO | Admitting: Family Medicine

## 2023-05-18 ENCOUNTER — Encounter: Payer: Self-pay | Admitting: Family Medicine

## 2023-05-18 ENCOUNTER — Ambulatory Visit (INDEPENDENT_AMBULATORY_CARE_PROVIDER_SITE_OTHER): Payer: Medicare HMO | Admitting: Family Medicine

## 2023-05-18 VITALS — BP 138/86 | HR 87 | Ht 62.0 in | Wt 91.0 lb

## 2023-05-18 DIAGNOSIS — E559 Vitamin D deficiency, unspecified: Secondary | ICD-10-CM | POA: Diagnosis not present

## 2023-05-18 DIAGNOSIS — G8929 Other chronic pain: Secondary | ICD-10-CM

## 2023-05-18 DIAGNOSIS — Z1231 Encounter for screening mammogram for malignant neoplasm of breast: Secondary | ICD-10-CM

## 2023-05-18 DIAGNOSIS — M25511 Pain in right shoulder: Secondary | ICD-10-CM | POA: Diagnosis not present

## 2023-05-18 DIAGNOSIS — Z114 Encounter for screening for human immunodeficiency virus [HIV]: Secondary | ICD-10-CM

## 2023-05-18 DIAGNOSIS — F419 Anxiety disorder, unspecified: Secondary | ICD-10-CM | POA: Diagnosis not present

## 2023-05-18 DIAGNOSIS — Z1211 Encounter for screening for malignant neoplasm of colon: Secondary | ICD-10-CM

## 2023-05-18 DIAGNOSIS — E7849 Other hyperlipidemia: Secondary | ICD-10-CM

## 2023-05-18 DIAGNOSIS — Z1159 Encounter for screening for other viral diseases: Secondary | ICD-10-CM

## 2023-05-18 DIAGNOSIS — M19011 Primary osteoarthritis, right shoulder: Secondary | ICD-10-CM

## 2023-05-18 DIAGNOSIS — R7301 Impaired fasting glucose: Secondary | ICD-10-CM

## 2023-05-18 DIAGNOSIS — Z13820 Encounter for screening for osteoporosis: Secondary | ICD-10-CM

## 2023-05-18 DIAGNOSIS — E038 Other specified hypothyroidism: Secondary | ICD-10-CM

## 2023-05-18 MED ORDER — DICLOFENAC SODIUM 1 % EX GEL: 2.0000 g | Freq: Four times a day (QID) | CUTANEOUS | 1 refills | Status: DC

## 2023-05-18 NOTE — Patient Instructions (Addendum)
I appreciate the opportunity to provide care to you today!    Follow up:  3 months  Labs: please stop by the lab during the week to get your blood drawn (CBC, CMP, TSH, Lipid profile, HgA1c, Vit D)  Screening: HIV and Hep C  Please schedule medicare annual wellness visit Please schedule mammogram  Arthritis of right shoulder -continue taking arthritis tylenol -apply Voltaren gel as needed - recommend performing strengthening and stretching exercises of the right shoulder   Attached with your AVS, you will find valuable resources for self-education. I highly recommend dedicating some time to thoroughly examine them.   Please continue to a heart-healthy diet and increase your physical activities. Try to exercise for at least five days a week.    It was a pleasure to see you and I look forward to continuing to work together on your health and well-being. Please do not hesitate to call the office if you need care or have questions about your care.  In case of emergency, please visit the Emergency Department for urgent care, or contact our clinic at [phone] to schedule an appointment. We're here to help you!   Have a wonderful day and week. With Gratitude, Gilmore Laroche MSN, FNP-BC

## 2023-05-18 NOTE — Progress Notes (Unsigned)
New Patient Office Visit  Subjective:  Patient ID: Rita Yang, female    DOB: Sep 28, 1950  Age: 72 y.o. MRN: [id number]  CC:  Chief Complaint  Patient presents with   New Patient (Initial Visit)    Establishing care.     HPI Rita Yang is a 72 y.o. female with past medical history of PVD, right shoulder pain, anxiety presents for establishing care. For the details of today's visit, please refer to the assessment and plan.    Past Medical History:  Diagnosis Date   Anxiety    Bronchitis     Past Surgical History:  Procedure Laterality Date   ABDOMINAL AORTOGRAM W/LOWER EXTREMITY Bilateral 12/27/2021   Procedure: ABDOMINAL AORTOGRAM W/LOWER EXTREMITY;  Surgeon: Chuck Hint, MD;  [facility] INVASIVE CV LAB;  Service: Cardiovascular;  Laterality: Bilateral;   AORTIC ARCH ANGIOGRAPHY N/A 12/27/2021   Procedure: AORTIC ARCH ANGIOGRAPHY;  Surgeon: Chuck Hint, MD;  Location: [facility] INVASIVE CV LAB;  Service: Cardiovascular;  Laterality: N/A;   HAND SURGERY     PERIPHERAL VASCULAR INTERVENTION Left 12/27/2021   Procedure: PERIPHERAL VASCULAR INTERVENTION;  Surgeon: Chuck Hint, MD;  Location: [facility] INVASIVE CV LAB;  Service: Cardiovascular;  Laterality: Left;    Family History  Problem Relation Age of Onset   Colon cancer Neg Hx    Colon polyps Neg Hx     Social History   Socioeconomic History   Marital status: Married    Spouse name: Not on file   Number of children: Not on file   Years of education: Not on file   Highest education level: Not on file  Occupational History   Not on file  Tobacco Use   Smoking status: Every Day    Current packs/day: 0.25    Types: Cigarettes   Smokeless tobacco: Not on file  Vaping Use   Vaping status: Never Used  Substance and Sexual Activity   Alcohol use: No   Drug use: No   Sexual activity: Not on file  Other Topics Concern   Not on file  Social History Narrative   Not on file   Social Determinants  of Health   Financial Resource Strain: Not on file  Food Insecurity: Not on file  Transportation Needs: Not on file  Physical Activity: Not on file  Stress: Not on file  Social Connections: Not on file  Intimate Partner Violence: Not on file    ROS Review of Systems  Objective:   Today's Vitals: BP 138/86 (BP Location: Left Arm)   Pulse 87   Ht 5\' 2"  (1.575 m)   Wt 91 lb 0.6 oz (41.3 kg)   SpO2 96%   BMI 16.65 kg/m   Physical Exam   Assessment & Plan:   Anxiety Assessment & Plan: Anxiety: The patient reports being on long-term therapy with diazepam for anxiety and wishes to remain on her current treatment regimen. Side effects of the treatment were reviewed, and she was informed that we will only provide 15 tablets per month as refills. The patient is compliant and agreeable to this plan of care, with no suicidal thoughts or ideation reported.  She also noted that her insurance only covers lab work once a year, and she will return to complete her blood work accordingly.    Chronic right shoulder pain Assessment & Plan: The patient complains of an achy sensation in the right shoulder accompanied by morning stiffness, likely indicative of arthritis. She denies any recent injury or  trauma and reports no numbness or tingling. Range of motion in the right shoulder is intact upon examination in the clinic.  Given that the patient is currently on anticoagulant therapy, she is encouraged to use Tylenol as needed for pain relief, apply heat to the affected shoulder, and engage in stretching and strengthening exercises. Topical Voltaren gel is also recommended. She is advised to follow up if her symptoms do not improve or if they worsen.   Orders: -     Diclofenac Sodium; Apply 2 g topically 4 (four) times daily.  Dispense: 50 g; Refill: 1  Osteoporosis screening -     HM DEXA SCAN  Vitamin D deficiency -     VITAMIN D 25 Hydroxy (Vit-D Deficiency, Fractures)  Need for  hepatitis C screening test -     Hepatitis C antibody  Encounter for screening for HIV -     HIV Antibody (routine testing w rflx)  IFG (impaired fasting glucose) -     Hemoglobin A1c  TSH (thyroid-stimulating hormone deficiency) -     TSH + free T4  Other hyperlipidemia -     Lipid panel -     CMP14+EGFR -     CBC with Differential/Platelet  Breast cancer screening by mammogram -     3D Screening Mammogram, Left and Right  Screening for colon cancer -     Cologuard  Note: This chart has been completed using Engineer, civil (consulting) software, and while attempts have been made to ensure accuracy, certain words and phrases may not be transcribed as intended.     Follow-up: No follow-ups on file.   Gilmore Laroche, FNP

## 2023-05-21 DIAGNOSIS — F419 Anxiety disorder, unspecified: Secondary | ICD-10-CM | POA: Insufficient documentation

## 2023-05-21 DIAGNOSIS — M25511 Pain in right shoulder: Secondary | ICD-10-CM | POA: Insufficient documentation

## 2023-05-21 NOTE — Assessment & Plan Note (Signed)
Anxiety: The patient reports being on long-term therapy with diazepam for anxiety and wishes to remain on her current treatment regimen. Side effects of the treatment were reviewed, and she was informed that we will only provide 15 tablets per month as refills. The patient is compliant and agreeable to this plan of care, with no suicidal thoughts or ideation reported.  She also noted that her insurance only covers lab work once a year, and she will return to complete her blood work accordingly.

## 2023-05-21 NOTE — Assessment & Plan Note (Signed)
The patient complains of an achy sensation in the right shoulder accompanied by morning stiffness, likely indicative of arthritis. She denies any recent injury or trauma and reports no numbness or tingling. Range of motion in the right shoulder is intact upon examination in the clinic.  Given that the patient is currently on anticoagulant therapy, she is encouraged to use Tylenol as needed for pain relief, apply heat to the affected shoulder, and engage in stretching and strengthening exercises. Topical Voltaren gel is also recommended. She is advised to follow up if her symptoms do not improve or if they worsen.

## 2023-06-01 ENCOUNTER — Telehealth: Payer: Self-pay

## 2023-06-01 NOTE — Telephone Encounter (Signed)
Spoke to pharmacy, states they typically dont fill this since this can be bought over the counter. Let the patient know.

## 2023-08-21 ENCOUNTER — Other Ambulatory Visit: Payer: Self-pay

## 2023-08-21 DIAGNOSIS — I70219 Atherosclerosis of native arteries of extremities with intermittent claudication, unspecified extremity: Secondary | ICD-10-CM

## 2023-08-24 ENCOUNTER — Telehealth: Payer: Self-pay | Admitting: Family Medicine

## 2023-08-24 ENCOUNTER — Encounter: Payer: Self-pay | Admitting: Internal Medicine

## 2023-08-24 ENCOUNTER — Ambulatory Visit (INDEPENDENT_AMBULATORY_CARE_PROVIDER_SITE_OTHER): Payer: Medicare HMO | Admitting: Internal Medicine

## 2023-08-24 VITALS — BP 170/100 | HR 83 | Ht 62.0 in | Wt 90.4 lb

## 2023-08-24 DIAGNOSIS — M25512 Pain in left shoulder: Secondary | ICD-10-CM

## 2023-08-24 DIAGNOSIS — G8929 Other chronic pain: Secondary | ICD-10-CM

## 2023-08-24 DIAGNOSIS — M25511 Pain in right shoulder: Secondary | ICD-10-CM | POA: Diagnosis not present

## 2023-08-24 NOTE — Telephone Encounter (Signed)
 That's fine

## 2023-08-24 NOTE — Assessment & Plan Note (Signed)
 Presenting today for acute visit endorsing gradually worsening bilateral shoulder pain (R>L) over the last 3-4 months.  She endorses a history of arthritis in her right shoulder.  Of note, right shoulder x-rays obtained 2016 show preserved joint space.  She is currently using Tylenol  and Voltaren  gel as needed for pain relief.  ROM is limited secondary to pain.  Positive empty can bilaterally.  She endorses pain with resisted internal rotation.  Suspect rotator cuff pathology vs arthritis. -Treatment options reviewed.  I recommended a referral to orthopedic surgery to discuss subacromial steroid injection.  She has failed conservative measures.  NSAIDs contraindicated in the setting of potential antiplatelet use.  She was post to stop Plavix  in August 2024, but continues to fill prescriptions for Plavix .  Last filled yesterday (2/9) but she says she is not taking it. -Home PT exercises provided. -She will return to care for routine follow-up in March with her PCP

## 2023-08-24 NOTE — Telephone Encounter (Signed)
 Dr Kermit Ped, please advise on scheduling. Thank you

## 2023-08-24 NOTE — Telephone Encounter (Signed)
 Patient wanting to switch providers from [place] to Dr Kermit Ped says she enjoyed her visit with him and feels that her medication has been all over the place. Please advise on scheduling Thank you

## 2023-08-24 NOTE — Patient Instructions (Signed)
 It was a pleasure to see you today.  Thank you for giving us  the opportunity to be involved in your care.  Below is a brief recap of your visit and next steps.  We will plan to see you again in March.  Summary Orthopedic surgery referral placed today Please see the attached exercises for your shoulders Follow up next month for routine care

## 2023-08-24 NOTE — Progress Notes (Signed)
   Acute Office Visit  Subjective:     Patient ID: Rita Yang, female    DOB: 08-05-1950, 73 y.o.   MRN: [id number]  Chief Complaint  Patient presents with   Arthritis    Arthritis in both arms, super painful   Rita Yang presents today for an acute visit endorsing a 3-36-month history of gradually worsening bilateral shoulder pain (R>L).  She states that she has been told she has arthritis in her right shoulder.  She uses Tylenol  and Voltaren  gel as needed for pain relief.  Pain has worsened to the point that she encounters significant difficulty in completing daily activities such as combing her hair and cleaning her home.  She has purchased a wig because of discomfort with brushing her hair.  Review of Systems  Musculoskeletal:  Positive for joint pain (Bilateral shoulder pain (R>L)).      Objective:    BP (!) 170/100 (BP Location: Left Arm, Patient Position: Sitting, Cuff Size: Normal)   Pulse 83   Ht 5\' 2"  (1.575 m)   Wt 90 lb 6.4 oz (41 kg)   SpO2 95%   BMI 16.53 kg/m   Physical Exam Musculoskeletal:     Comments: No obvious deformity on inspection of the shoulder.  No tenderness to palpation.  Active ROM is reduced in all directions secondary to pain.  She can abduct to 90 degrees but encounters discomfort.  Can passively abduct to 180 degrees.  Positive empty can bilaterally.  She encounters pain with resisted internal rotation.       Assessment & Plan:   Problem List Items Addressed This Visit       Bilateral shoulder pain - Primary   Presenting today for acute visit endorsing gradually worsening bilateral shoulder pain (R>L) over the last 3-4 months.  She endorses a history of arthritis in her right shoulder.  Of note, right shoulder x-rays obtained 2016 show preserved joint space.  She is currently using Tylenol  and Voltaren  gel as needed for pain relief.  ROM is limited secondary to pain.  Positive empty can bilaterally.  She endorses pain with resisted internal  rotation.  Suspect rotator cuff pathology vs arthritis. -Treatment options reviewed.  I recommended a referral to orthopedic surgery to discuss subacromial steroid injection.  She has failed conservative measures.  NSAIDs contraindicated in the setting of potential antiplatelet use.  She was post to stop Plavix  in August 2024, but continues to fill prescriptions for Plavix .  Last filled yesterday (2/9) but she says she is not taking it. -Home PT exercises provided. -She will return to care for routine follow-up in March with her PCP      Return if symptoms worsen or fail to improve.  Tobi Fortes, MD

## 2023-08-26 ENCOUNTER — Ambulatory Visit: Payer: Medicare HMO | Admitting: Orthopedic Surgery

## 2023-08-31 ENCOUNTER — Telehealth: Payer: Self-pay

## 2023-08-31 NOTE — Telephone Encounter (Signed)
 Patient lvm on 2/14 @ 12:23pm stating that she has an appointment on Tuesday 1:30 pm. She never stated what she needed just to call her back. 2/17 @ 8:47 am I returned her call but never got an answer,  only got message stating that mailbox is full.

## 2023-09-01 ENCOUNTER — Encounter: Payer: Self-pay | Admitting: Orthopedic Surgery

## 2023-09-01 ENCOUNTER — Telehealth: Payer: Self-pay | Admitting: Family Medicine

## 2023-09-01 ENCOUNTER — Other Ambulatory Visit: Payer: Self-pay

## 2023-09-01 ENCOUNTER — Other Ambulatory Visit (INDEPENDENT_AMBULATORY_CARE_PROVIDER_SITE_OTHER): Payer: Self-pay

## 2023-09-01 ENCOUNTER — Ambulatory Visit (INDEPENDENT_AMBULATORY_CARE_PROVIDER_SITE_OTHER): Payer: Medicare HMO | Admitting: Orthopedic Surgery

## 2023-09-01 VITALS — BP 130/84 | HR 80 | Ht 62.0 in | Wt 89.2 lb

## 2023-09-01 DIAGNOSIS — M12811 Other specific arthropathies, not elsewhere classified, right shoulder: Secondary | ICD-10-CM | POA: Diagnosis not present

## 2023-09-01 DIAGNOSIS — M25511 Pain in right shoulder: Secondary | ICD-10-CM

## 2023-09-01 DIAGNOSIS — M12812 Other specific arthropathies, not elsewhere classified, left shoulder: Secondary | ICD-10-CM

## 2023-09-01 DIAGNOSIS — M25512 Pain in left shoulder: Secondary | ICD-10-CM | POA: Diagnosis not present

## 2023-09-01 NOTE — Telephone Encounter (Signed)
 Pt VM full

## 2023-09-01 NOTE — Patient Instructions (Signed)

## 2023-09-01 NOTE — Progress Notes (Signed)
 New Patient Visit  Assessment: Rita Yang is a 73 y.o. female with the following: 1. Rotator cuff arthropathy of right shoulder 2. Rotator cuff arthropathy of left shoulder  Plan: Rita Yang has pain in both shoulders.  Right is worse than the left.  Radiographs demonstrates bilateral rotator cuff arthropathy, with proximal humeral migration.  More advanced on the right, where there is also signs of advanced degenerative changes.  She is not interested in surgery.  She has not had injections in the past.  I believe injections could be effective.  Both shoulders were completed in clinic today.  She will return to clinic as needed.   Procedure note injection - Right shoulder    Verbal consent was obtained to inject the right shoulder, subacromial space Timeout was completed to confirm the site of injection.   The skin was prepped with alcohol and ethyl chloride was sprayed at the injection site.  A 21-gauge needle was used to inject 40 mg of Depo-Medrol and 1% lidocaine (4 cc) into the subacromial space of the right shoulder using a posterolateral approach.  There were no complications.  A sterile bandage was applied.    Procedure note injection Left shoulder    Verbal consent was obtained to inject the left shoulder, subacromial space Timeout was completed to confirm the site of injection.  The skin was prepped with alcohol and ethyl chloride was sprayed at the injection site.  A 21-gauge needle was used to inject 40 mg of Depo-Medrol and 1% lidocaine (4 cc) into the subacromial space of the left shoulder using a posterolateral approach.  There were no complications. A sterile bandage was applied.   Follow-up: Return if symptoms worsen or fail to improve.  Subjective:  Chief Complaint  Patient presents with   Shoulder Pain    Bilat shoulder started w R then L. Pt states has been havng pain approx 3 mos     History of Present Illness: Rita Yang is a 73 y.o. female who has been  referred by Delmar Landau, MD for evaluation of bilateral shoulder pain.  She has had pain in both shoulders for a couple of months.  No specific injury.  She is right-hand dominant.  Right is worse than left.  She has some difficulty with overhead motion.  She has not taken any medications.  No prior injections.  No history of an injury to either shoulder.   Review of Systems: No fevers or chills No numbness or tingling No chest pain No shortness of breath No bowel or bladder dysfunction No GI distress No headaches   Medical History:  Past Medical History:  Diagnosis Date   Anxiety    Bronchitis     Past Surgical History:  Procedure Laterality Date   ABDOMINAL AORTOGRAM W/LOWER EXTREMITY Bilateral 12/27/2021   Procedure: ABDOMINAL AORTOGRAM W/LOWER EXTREMITY;  Surgeon: Chuck Hint, MD;  Location: [facility] INVASIVE CV LAB;  Service: Cardiovascular;  Laterality: Bilateral;   AORTIC ARCH ANGIOGRAPHY N/A 12/27/2021   Procedure: AORTIC ARCH ANGIOGRAPHY;  Surgeon: Chuck Hint, MD;  Location: [facility] INVASIVE CV LAB;  Service: Cardiovascular;  Laterality: N/A;   HAND SURGERY     PERIPHERAL VASCULAR INTERVENTION Left 12/27/2021   Procedure: PERIPHERAL VASCULAR INTERVENTION;  Surgeon: Chuck Hint, MD;  Location: [facility] INVASIVE CV LAB;  Service: Cardiovascular;  Laterality: Left;    Family History  Problem Relation Age of Onset   Colon cancer Neg Hx    Colon polyps Neg Hx  Social History   Tobacco Use   Smoking status: Every Day    Current packs/day: 0.25    Types: Cigarettes  Vaping Use   Vaping status: Never Used  Substance Use Topics   Alcohol use: No   Drug use: No    Allergies  Allergen Reactions   Other Other (See Comments)    Flu shot. "hot"    Current Meds  Medication Sig   albuterol (PROVENTIL HFA;VENTOLIN HFA) 108 (90 Base) MCG/ACT inhaler Inhale 1-2 puffs into the lungs every 6 (six) hours as needed for wheezing or shortness of breath.    ASPIRIN 81 PO Take by mouth.   cholecalciferol (VITAMIN D3) 25 MCG (1000 UNIT) tablet Take 1,000 Units by mouth daily.   clopidogrel (PLAVIX) 75 MG tablet Take 1 tablet (75 mg total) by mouth daily.   clopidogrel (PLAVIX) 75 MG tablet Take 1 tablet (75 mg total) by mouth daily.   diazepam (VALIUM) 5 MG tablet Take 5 mg by mouth every 8 (eight) hours as needed for anxiety.   diclofenac Sodium (VOLTAREN) 1 % GEL Apply 2 g topically 4 (four) times daily.   ibuprofen (ADVIL) 200 MG tablet Take 200 mg by mouth every 8 (eight) hours as needed for moderate pain.   losartan (COZAAR) 25 MG tablet Take 25 mg by mouth daily.   nicotine (NICODERM CQ) 21 mg/24hr patch Place 1 patch (21 mg total) onto the skin daily.   rosuvastatin (CRESTOR) 20 MG tablet Take 1 tablet by mouth once daily   SPIRIVA HANDIHALER 18 MCG inhalation capsule Place 1 capsule into inhaler and inhale daily.    Objective: BP 130/84   Pulse 80   Ht 5\' 2"  (1.575 m)   Wt 89 lb 3.2 oz (40.5 kg)   BMI 16.31 kg/m   Physical Exam:  General: Alert and oriented. and No acute distress. Gait: Normal gait.  Thin female.  Bilateral shoulders without deformity.  No redness.  No swelling.  Forward flexion limited to 120 degrees on the right.  130 degrees of forward flexion on the left.  There is some crepitus.  Fingers are warm and well-perfused.  Sensation is intact in the axillary nerve distribution.  IMAGING: I personally ordered and reviewed the following images   X-rays of the right shoulder were obtained in clinic today.  No acute injuries are noted.  There is proximal humeral migration, as well as degenerative changes noted within the humeral head, as well as the glenoid.  Humeral head is abutting the undersurface of the acromion, there is somewhere within the acromion.  No bony lesions.  Impression: Right shoulder x-rays with advanced degenerative changes, as well as proximal humeral migration.   X-rays of the left shoulder were  obtained in clinic today.  No acute injuries are noted.  There is proximal humeral migration.  Very little room between the humeral head and the undersurface of the acromion.  No bony lesions.  Minimal degenerative changes otherwise.  Impression: Left shoulder x-rays with proximal humeral migration, consistent with arthropathy   New Medications:  No orders of the defined types were placed in this encounter.     Oliver Barre, MD  09/01/2023 2:30 PM

## 2023-09-01 NOTE — Telephone Encounter (Signed)
 Copied from CRM [id number]. Topic: General - Other >> Sep 01, 2023  9:11 AM Maxwell Marion wrote: Reason for CRM: Patient is aware Dr. Durwin Nora cannot accept her as new patient. Please contact her to schedule her with another provider in office that is accepting new patients as I am not aware of which providers are/are not accepting internal transfer requests.

## 2023-09-04 ENCOUNTER — Ambulatory Visit: Payer: Medicare HMO

## 2023-09-04 ENCOUNTER — Ambulatory Visit (HOSPITAL_COMMUNITY): Payer: Medicare HMO

## 2023-09-11 ENCOUNTER — Telehealth: Payer: Self-pay | Admitting: Family Medicine

## 2023-09-11 NOTE — Telephone Encounter (Signed)
 Called pt to schedule mammogram Mammo bus on site 3/11

## 2023-09-21 ENCOUNTER — Ambulatory Visit: Payer: Medicare HMO | Admitting: Family Medicine

## 2023-09-22 ENCOUNTER — Ambulatory Visit: Payer: Self-pay | Admitting: Family Medicine

## 2023-10-19 ENCOUNTER — Ambulatory Visit: Payer: Self-pay | Admitting: Internal Medicine

## 2023-10-22 ENCOUNTER — Ambulatory Visit: Payer: Medicare HMO | Admitting: Family Medicine

## 2023-10-29 ENCOUNTER — Ambulatory Visit: Payer: Self-pay | Admitting: Internal Medicine

## 2023-11-06 ENCOUNTER — Ambulatory Visit (INDEPENDENT_AMBULATORY_CARE_PROVIDER_SITE_OTHER)

## 2023-11-06 VITALS — BP 158/90 | HR 82 | Resp 16 | Ht 62.0 in | Wt 85.0 lb

## 2023-11-06 DIAGNOSIS — G8929 Other chronic pain: Secondary | ICD-10-CM

## 2023-11-06 DIAGNOSIS — I10 Essential (primary) hypertension: Secondary | ICD-10-CM

## 2023-11-06 DIAGNOSIS — F419 Anxiety disorder, unspecified: Secondary | ICD-10-CM

## 2023-11-06 DIAGNOSIS — M12811 Other specific arthropathies, not elsewhere classified, right shoulder: Secondary | ICD-10-CM

## 2023-11-06 DIAGNOSIS — R5383 Other fatigue: Secondary | ICD-10-CM | POA: Diagnosis not present

## 2023-11-06 DIAGNOSIS — R7301 Impaired fasting glucose: Secondary | ICD-10-CM | POA: Insufficient documentation

## 2023-11-06 DIAGNOSIS — M25512 Pain in left shoulder: Secondary | ICD-10-CM

## 2023-11-06 DIAGNOSIS — M25511 Pain in right shoulder: Secondary | ICD-10-CM

## 2023-11-06 DIAGNOSIS — M12812 Other specific arthropathies, not elsewhere classified, left shoulder: Secondary | ICD-10-CM

## 2023-11-06 DIAGNOSIS — E7849 Other hyperlipidemia: Secondary | ICD-10-CM

## 2023-11-06 DIAGNOSIS — E559 Vitamin D deficiency, unspecified: Secondary | ICD-10-CM | POA: Diagnosis not present

## 2023-11-06 MED ORDER — DIAZEPAM 5 MG PO TABS: 5.0000 mg | ORAL_TABLET | Freq: Three times a day (TID) | ORAL | 0 refills | Status: DC | PRN

## 2023-11-06 MED ORDER — LOSARTAN POTASSIUM 50 MG PO TABS: 50.0000 mg | ORAL_TABLET | Freq: Every day | ORAL | 1 refills | Status: DC

## 2023-11-06 MED ORDER — CELECOXIB 100 MG PO CAPS: 100.0000 mg | ORAL_CAPSULE | Freq: Every day | ORAL | 5 refills | Status: DC

## 2023-11-06 NOTE — Progress Notes (Signed)
 Acute Office Visit  Subjective:     Patient ID: Rita Yang, female    DOB: Dec 22, 1950, 73 y.o.   MRN: [id number]  Chief Complaint  Patient presents with   Arthritis    Pt complains of ongoing arthritis is bilateral shoulders. Has been seeing Dr. Ernesta Heading for injections but feels like they are not helping    Fatigue    Complains of fatigue and low energy for last few months. Denies dizziness or chest pains    HPI Patient is in today for shoulder pain and fatigue  Review of Systems  Constitutional:  Positive for malaise/fatigue. Negative for diaphoresis, fever and weight loss.  Respiratory: Negative.    Cardiovascular: Negative.   Musculoskeletal:  Positive for joint pain (both shoulders) and myalgias.  Neurological: Negative.   Psychiatric/Behavioral: Negative.          Objective:    BP (!) 158/90   Pulse 82   Resp 16   Ht 5\' 2"  (1.575 m)   Wt 85 lb (38.6 kg)   SpO2 98%   BMI 15.55 kg/m    Physical Exam Vitals and nursing note reviewed.  Constitutional:      Appearance: Normal appearance.  Eyes:     Extraocular Movements: Extraocular movements intact.     Pupils: Pupils are equal, round, and reactive to light.  Cardiovascular:     Rate and Rhythm: Normal rate and regular rhythm.     Pulses: Normal pulses.  Pulmonary:     Effort: Pulmonary effort is normal.     Breath sounds: Normal breath sounds.  Musculoskeletal:     Right shoulder: Tenderness present. Decreased range of motion. Decreased strength.     Left shoulder: No tenderness. Decreased range of motion. Decreased strength.     Cervical back: Normal range of motion.  Neurological:     Mental Status: She is alert and oriented to person, place, and time.  Psychiatric:        Mood and Affect: Mood normal.        Thought Content: Thought content normal.     Results for orders placed or performed in visit on 11/06/23  CMP14+EGFR  Result Value Ref Range   Glucose 84 70 - 99 mg/dL   BUN 21 8 - 27  mg/dL   Creatinine, Ser 9.56 (H) 0.57 - 1.00 mg/dL   eGFR 45 (L) >21 HY/QMV/7.84   BUN/Creatinine Ratio 17 12 - 28   Sodium 141 134 - 144 mmol/L   Potassium 4.4 3.5 - 5.2 mmol/L   Chloride 101 96 - 106 mmol/L   CO2 25 20 - 29 mmol/L   Calcium  9.3 8.7 - 10.3 mg/dL   Total Protein 7.3 6.0 - 8.5 g/dL   Albumin 4.4 3.8 - 4.8 g/dL   Globulin, Total 2.9 1.5 - 4.5 g/dL   Bilirubin Total 0.5 0.0 - 1.2 mg/dL   Alkaline Phosphatase 77 44 - 121 IU/L   AST 19 0 - 40 IU/L   ALT 9 0 - 32 IU/L  Lipid Profile  Result Value Ref Range   Cholesterol, Total 162 100 - 199 mg/dL   Triglycerides 77 0 - 149 mg/dL   HDL 71 >69 mg/dL   VLDL Cholesterol Cal 15 5 - 40 mg/dL   LDL Chol Calc (NIH) 76 0 - 99 mg/dL   Chol/HDL Ratio 2.3 0.0 - 4.4 ratio  CBC  Result Value Ref Range   WBC 4.2 3.4 - 10.8 x10E3/uL   RBC 3.91 3.77 -  5.28 x10E6/uL   Hemoglobin 12.2 11.1 - 15.9 g/dL   Hematocrit 16.1 09.6 - 46.6 %   MCV 95 79 - 97 fL   MCH 31.2 26.6 - 33.0 pg   MCHC 33.0 31.5 - 35.7 g/dL   RDW 04.5 40.9 - 81.1 %   Platelets 196 150 - 450 x10E3/uL  Vitamin D (25 hydroxy)  Result Value Ref Range   Vit D, 25-Hydroxy 144.0 (H) 30.0 - 100.0 ng/mL  TSH + free T4  Result Value Ref Range   TSH 0.519 0.450 - 4.500 uIU/mL   Free T4 1.43 0.82 - 1.77 ng/dL  Hemoglobin B1Y  Result Value Ref Range   Hgb A1c MFr Bld 5.2 4.8 - 5.6 %   Est. average glucose Bld gHb Est-mCnc 103 mg/dL        Assessment & Plan:   Problem List Items Addressed This Visit       Cardiovascular and Mediastinum   Hypertension (Chronic)   Elevated today, but she has not had her medicine this morning.  Losartan refilled.  Recommend DASH diet and regular exercise      Relevant Medications   losartan (COZAAR) 50 MG tablet     Endocrine   IFG (impaired fasting glucose)   Check fasting labs today follow-up according to lab results      Relevant Orders   CMP14+EGFR (Completed)   HgB A1c     Musculoskeletal and Integument   Rotator  cuff arthropathy of both shoulders   Ongoing for years, but getting worse.  Will add Celebrex for arthritis pain.  She is to see orthopedic next week for injections in her shoulders      Relevant Medications   celecoxib (CELEBREX) 100 MG capsule     Other   Anxiety   Anxiety: The patient reports being on long-term therapy with diazepam for anxiety and wishes to remain on her current treatment regimen. Side effects of the treatment were reviewed, and she was provided with a refill of this.  The patient is compliant and agreeable to this plan of care, with no suicidal thoughts or ideation reported.       Relevant Medications   diazepam (VALIUM) 5 MG tablet   Bilateral shoulder pain   Been going on for years.  She has received injections in the past.  Has appointment with Ortho for next week for further injections.  Celebrex added for pain relief      Vitamin D deficiency   Recheck levels to reevaluate deficiency.  She reports taking vitamin D supplement daily.      Relevant Orders   Vitamin D (25 hydroxy) (Completed)   Other hyperlipidemia   Check fasting labs today.  Follow-up according to lab results.  She is currently taking Crestor  20 mg.      Relevant Medications   losartan (COZAAR) 50 MG tablet   Other Relevant Orders   CMP14+EGFR (Completed)   Lipid Profile (Completed)   Other Visit Diagnoses       Fatigue, unspecified type    -  Primary   Relevant Orders   CBC (Completed)   TSH + free T4 (Completed)       Meds ordered this encounter  Medications   losartan (COZAAR) 50 MG tablet    Sig: Take 1 tablet (50 mg total) by mouth daily.    Dispense:  90 tablet    Refill:  1   celecoxib (CELEBREX) 100 MG capsule    Sig: Take 1 capsule (100  mg total) by mouth daily.    Dispense:  30 capsule    Refill:  5   diazepam (VALIUM) 5 MG tablet    Sig: Take 1 tablet (5 mg total) by mouth every 8 (eight) hours as needed for anxiety.    Dispense:  90 tablet    Refill:  0     No follow-ups on file.  Alison Irvine, FNP

## 2023-11-06 NOTE — Patient Instructions (Signed)
 Check labs today to further evaluate fatigue.   Losartan (blood pressure) dose increased to 50 mg.  May take two of the 25 mg tablets until gone.    Celebrex sent to pharmacy for arthritis pain in shoulders.

## 2023-11-06 NOTE — Telephone Encounter (Signed)
 Patient asking to switch providers from [facility] to [facility].  Feels more comfortable with Alison Irvine.

## 2023-11-07 ENCOUNTER — Other Ambulatory Visit: Payer: Self-pay | Admitting: Family Medicine

## 2023-11-07 LAB — CBC
Hematocrit: 37 % (ref 34.0–46.6)
Hemoglobin: 12.2 g/dL (ref 11.1–15.9)
MCH: 31.2 pg (ref 26.6–33.0)
MCHC: 33 g/dL (ref 31.5–35.7)
MCV: 95 fL (ref 79–97)
Platelets: 196 10*3/uL (ref 150–450)
RBC: 3.91 x10E6/uL (ref 3.77–5.28)
RDW: 12 % (ref 11.7–15.4)
WBC: 4.2 10*3/uL (ref 3.4–10.8)

## 2023-11-07 LAB — TSH+FREE T4
Free T4: 1.43 ng/dL (ref 0.82–1.77)
TSH: 0.519 u[IU]/mL (ref 0.450–4.500)

## 2023-11-07 LAB — LIPID PANEL
Chol/HDL Ratio: 2.3 ratio (ref 0.0–4.4)
Cholesterol, Total: 162 mg/dL (ref 100–199)
HDL: 71 mg/dL (ref >39)
LDL Chol Calc (NIH): 76 mg/dL (ref 0–99)
Triglycerides: 77 mg/dL (ref 0–149)
VLDL Cholesterol Cal: 15 mg/dL (ref 5–40)

## 2023-11-07 LAB — CMP14+EGFR
ALT: 9 IU/L (ref 0–32)
AST: 19 IU/L (ref 0–40)
Albumin: 4.4 g/dL (ref 3.8–4.8)
Alkaline Phosphatase: 77 IU/L (ref 44–121)
BUN/Creatinine Ratio: 17 (ref 12–28)
BUN: 21 mg/dL (ref 8–27)
Bilirubin Total: 0.5 mg/dL (ref 0.0–1.2)
CO2: 25 mmol/L (ref 20–29)
Calcium: 9.3 mg/dL (ref 8.7–10.3)
Chloride: 101 mmol/L (ref 96–106)
Creatinine, Ser: 1.24 mg/dL — ABNORMAL HIGH (ref 0.57–1.00)
Globulin, Total: 2.9 g/dL (ref 1.5–4.5)
Glucose: 84 mg/dL (ref 70–99)
Potassium: 4.4 mmol/L (ref 3.5–5.2)
Sodium: 141 mmol/L (ref 134–144)
Total Protein: 7.3 g/dL (ref 6.0–8.5)
eGFR: 45 mL/min/{1.73_m2} — ABNORMAL LOW (ref >59)

## 2023-11-07 LAB — HEMOGLOBIN A1C
Est. average glucose Bld gHb Est-mCnc: 103 mg/dL
Hgb A1c MFr Bld: 5.2 % (ref 4.8–5.6)

## 2023-11-07 LAB — VITAMIN D 25 HYDROXY (VIT D DEFICIENCY, FRACTURES): Vit D, 25-Hydroxy: 144 ng/mL — ABNORMAL HIGH (ref 30.0–100.0)

## 2023-11-07 NOTE — Telephone Encounter (Signed)
 That's fine

## 2023-11-09 DIAGNOSIS — M12811 Other specific arthropathies, not elsewhere classified, right shoulder: Secondary | ICD-10-CM | POA: Insufficient documentation

## 2023-11-09 DIAGNOSIS — E7849 Other hyperlipidemia: Secondary | ICD-10-CM | POA: Insufficient documentation

## 2023-11-09 NOTE — Assessment & Plan Note (Signed)
 Check fasting labs today.  Follow-up according to lab results.  She is currently taking Crestor  20 mg.

## 2023-11-09 NOTE — Assessment & Plan Note (Signed)
 Ongoing for years, but getting worse.  Will add Celebrex for arthritis pain.  She is to see orthopedic next week for injections in her shoulders

## 2023-11-09 NOTE — Telephone Encounter (Signed)
 Called patient x2 mail box is full. Okay to see Alison Irvine.

## 2023-11-09 NOTE — Assessment & Plan Note (Signed)
 Check fasting labs today follow-up according to lab results.

## 2023-11-09 NOTE — Assessment & Plan Note (Signed)
 Recheck levels to reevaluate deficiency.  She reports taking vitamin D supplement daily.

## 2023-11-09 NOTE — Assessment & Plan Note (Signed)
 Been going on for years.  She has received injections in the past.  Has appointment with Ortho for next week for further injections.  Celebrex added for pain relief

## 2023-11-09 NOTE — Assessment & Plan Note (Signed)
 Anxiety: The patient reports being on long-term therapy with diazepam for anxiety and wishes to remain on her current treatment regimen. Side effects of the treatment were reviewed, and she was provided with a refill of this.  The patient is compliant and agreeable to this plan of care, with no suicidal thoughts or ideation reported.

## 2023-11-09 NOTE — Assessment & Plan Note (Signed)
 Elevated today, but she has not had her medicine this morning.  Losartan refilled.  Recommend DASH diet and regular exercise

## 2023-11-10 ENCOUNTER — Ambulatory Visit (INDEPENDENT_AMBULATORY_CARE_PROVIDER_SITE_OTHER): Admitting: Orthopedic Surgery

## 2023-11-10 ENCOUNTER — Encounter: Payer: Self-pay | Admitting: Orthopedic Surgery

## 2023-11-10 DIAGNOSIS — M12812 Other specific arthropathies, not elsewhere classified, left shoulder: Secondary | ICD-10-CM

## 2023-11-10 DIAGNOSIS — M12811 Other specific arthropathies, not elsewhere classified, right shoulder: Secondary | ICD-10-CM

## 2023-11-10 MED ORDER — METHYLPREDNISOLONE ACETATE 40 MG/ML IJ SUSP
40.0000 mg | Freq: Once | INTRAMUSCULAR | Status: AC
  Administered 2023-11-10: 40 mg via INTRA_ARTICULAR

## 2023-11-10 NOTE — Patient Instructions (Signed)

## 2023-11-10 NOTE — Progress Notes (Signed)
 Return patient Visit  Assessment: Rita Yang is a 73 y.o. female with the following: 1. Rotator cuff arthropathy of right shoulder 2. Rotator cuff arthropathy of left shoulder  Plan: Rita Yang has recurrence of the pain in her shoulders.  She has a rotator cuff arthropathy bilaterally.  Right is worse than left.  Prior injection was helpful until a couple weeks ago.  She is interested in repeat injections today.  I would like to wait 3 months until the next injections.  Procedure note injection - Right shoulder    Verbal consent was obtained to inject the right shoulder, subacromial space Timeout was completed to confirm the site of injection.   The skin was prepped with alcohol and ethyl chloride was sprayed at the injection site.  A 21-gauge needle was used to inject 40 mg of Depo-Medrol and 1% lidocaine  (4 cc) into the subacromial space of the right shoulder using a posterolateral approach.  There were no complications.  A sterile bandage was applied.    Procedure note injection Left shoulder    Verbal consent was obtained to inject the left shoulder, subacromial space Timeout was completed to confirm the site of injection.  The skin was prepped with alcohol and ethyl chloride was sprayed at the injection site.  A 21-gauge needle was used to inject 40 mg of Depo-Medrol and 1% lidocaine  (4 cc) into the subacromial space of the left shoulder using a posterolateral approach.  There were no complications. A sterile bandage was applied.   Follow-up: Return if symptoms worsen or fail to improve.  Subjective:  Chief Complaint  Patient presents with   Injections    Both shoulders      History of Present Illness: Rita Yang is a 73 y.o. female who returns to clinic for repeat evaluation of bilateral shoulder pain.  Right is worse than left.  I saw her a couple months ago.  At that time, both shoulders were injected.  She states that she had excellent improvement in her symptoms  until a couple of weeks ago.  No specific injury.  The pain is returned.  She would like to repeat injections.  Review of Systems: No fevers or chills No numbness or tingling No chest pain No shortness of breath No bowel or bladder dysfunction No GI distress No headaches    Objective: There were no vitals taken for this visit.  Physical Exam:  General: Alert and oriented. and No acute distress. Gait: Normal gait.  Thin female.  Bilateral shoulders without deformity.  No redness.  No swelling.  Forward flexion limited to 120 degrees on the right.  130 degrees of forward flexion on the left.  There is some crepitus.  Fingers are warm and well-perfused.  Sensation is intact in the axillary nerve distribution.  IMAGING: I personally ordered and reviewed the following images     New Medications:  Meds ordered this encounter  Medications   methylPREDNISolone acetate (DEPO-MEDROL) injection 40 mg   methylPREDNISolone acetate (DEPO-MEDROL) injection 40 mg      Tonita Frater, MD  11/10/2023 4:05 PM

## 2023-11-12 ENCOUNTER — Encounter (HOSPITAL_COMMUNITY): Payer: Medicare HMO

## 2023-11-12 ENCOUNTER — Ambulatory Visit: Payer: Medicare HMO

## 2023-12-15 ENCOUNTER — Other Ambulatory Visit: Payer: Self-pay

## 2023-12-15 DIAGNOSIS — F419 Anxiety disorder, unspecified: Secondary | ICD-10-CM

## 2023-12-25 ENCOUNTER — Ambulatory Visit (HOSPITAL_COMMUNITY)

## 2023-12-25 ENCOUNTER — Ambulatory Visit: Attending: Vascular Surgery

## 2023-12-25 ENCOUNTER — Ambulatory Visit (HOSPITAL_COMMUNITY): Attending: Vascular Surgery

## 2023-12-30 ENCOUNTER — Telehealth: Payer: Self-pay

## 2023-12-30 NOTE — Telephone Encounter (Signed)
 Clinical staff has not called pt

## 2023-12-30 NOTE — Telephone Encounter (Signed)
 Copied from CRM [id number]. Topic: General - Other >> Dec 29, 2023  4:30 PM Trula Gable C wrote: Reason for CRM: Patient called in stared they she received a call from the office would like a callback regarding this

## 2023-12-30 NOTE — Telephone Encounter (Signed)
 Who called this pt, And what was it in regards to?

## 2024-01-11 ENCOUNTER — Telehealth: Payer: Self-pay

## 2024-01-11 NOTE — Telephone Encounter (Signed)
 Attempted to return pt's VM left on vein line. Her VM was full-unable to leave message.

## 2024-01-12 ENCOUNTER — Telehealth: Payer: Self-pay

## 2024-01-12 ENCOUNTER — Encounter: Payer: Self-pay | Admitting: Internal Medicine

## 2024-01-12 ENCOUNTER — Ambulatory Visit (INDEPENDENT_AMBULATORY_CARE_PROVIDER_SITE_OTHER): Payer: Self-pay | Admitting: Internal Medicine

## 2024-01-12 VITALS — BP 136/78 | HR 79 | Ht 62.0 in | Wt 87.0 lb

## 2024-01-12 DIAGNOSIS — B029 Zoster without complications: Secondary | ICD-10-CM | POA: Insufficient documentation

## 2024-01-12 MED ORDER — PREDNISONE 20 MG PO TABS: 40.0000 mg | ORAL_TABLET | Freq: Every day | ORAL | 0 refills | Status: AC

## 2024-01-12 MED ORDER — VALACYCLOVIR HCL 1 G PO TABS: 1000.0000 mg | ORAL_TABLET | Freq: Three times a day (TID) | ORAL | 0 refills | Status: AC

## 2024-01-12 NOTE — Progress Notes (Signed)
 Acute Office Visit  Subjective:    Patient ID: Rita Yang, female    DOB: 1951-04-15, 73 y.o.   MRN: [id number]  Chief Complaint  Patient presents with   Leg Pain    Pt reports left leg hurting, has felt some bumps and noticed scabs worried about shingles. Started last week.     HPI Patient is in today for complaint of acute onset rash on the left thigh area on the posterior side for the last 3 days.  She has noticed vesicular bumps and scabbing in the area.  She reports burning pain in the upper thigh and leg area.  Denies any previous history of shingles.  Has not been vaccinated against shingles.  Past Medical History:  Diagnosis Date   Anxiety    Bronchitis     Past Surgical History:  Procedure Laterality Date   ABDOMINAL AORTOGRAM W/LOWER EXTREMITY Bilateral 12/27/2021   Procedure: ABDOMINAL AORTOGRAM W/LOWER EXTREMITY;  Surgeon: Eliza Lonni RAMAN, MD;  Location: [facility] INVASIVE CV LAB;  Service: Cardiovascular;  Laterality: Bilateral;   AORTIC ARCH ANGIOGRAPHY N/A 12/27/2021   Procedure: AORTIC ARCH ANGIOGRAPHY;  Surgeon: Eliza Lonni RAMAN, MD;  Location: [facility] INVASIVE CV LAB;  Service: Cardiovascular;  Laterality: N/A;   HAND SURGERY     PERIPHERAL VASCULAR INTERVENTION Left 12/27/2021   Procedure: PERIPHERAL VASCULAR INTERVENTION;  Surgeon: Eliza Lonni RAMAN, MD;  Location: [facility] INVASIVE CV LAB;  Service: Cardiovascular;  Laterality: Left;    Family History  Problem Relation Age of Onset   Colon cancer Neg Hx    Colon polyps Neg Hx     Social History   Socioeconomic History   Marital status: Married    Spouse name: Not on file   Number of children: Not on file   Years of education: Not on file   Highest education level: Not on file  Occupational History   Not on file  Tobacco Use   Smoking status: Every Day    Current packs/day: 0.25    Types: Cigarettes   Smokeless tobacco: Not on file  Vaping Use   Vaping status: Never Used  Substance and Sexual  Activity   Alcohol use: No   Drug use: No   Sexual activity: Not on file  Other Topics Concern   Not on file  Social History Narrative   Not on file   Social Drivers of Health   Financial Resource Strain: Not on file  Food Insecurity: Not on file  Transportation Needs: Not on file  Physical Activity: Not on file  Stress: Not on file  Social Connections: Not on file  Intimate Partner Violence: Not on file    Outpatient Medications Prior to Visit  Medication Sig Dispense Refill   ASPIRIN  81 PO Take by mouth.     celecoxib  (CELEBREX ) 100 MG capsule Take 1 capsule (100 mg total) by mouth daily. 30 capsule 5   cholecalciferol (VITAMIN D3) 25 MCG (1000 UNIT) tablet Take 1,000 Units by mouth daily.     diazepam  (VALIUM ) 5 MG tablet TAKE 1 TABLET BY MOUTH EVERY 8 HOURS AS NEEDED FOR ANXIETY 90 tablet 0   ibuprofen  (ADVIL ) 200 MG tablet Take 200 mg by mouth every 8 (eight) hours as needed for moderate pain.     losartan  (COZAAR ) 50 MG tablet Take 1 tablet (50 mg total) by mouth daily. 90 tablet 1   rosuvastatin  (CRESTOR ) 20 MG tablet Take 1 tablet by mouth once daily 30 tablet 0   SPIRIVA HANDIHALER  18 MCG inhalation capsule Place 1 capsule into inhaler and inhale daily.     No facility-administered medications prior to visit.    Allergies  Allergen Reactions   Other Other (See Comments)    Flu shot. hot    Review of Systems  Constitutional:  Negative for chills and fever.  HENT:  Negative for congestion and sore throat.   Eyes:  Negative for pain and discharge.  Respiratory:  Negative for cough and shortness of breath.   Cardiovascular:  Negative for chest pain and palpitations.  Gastrointestinal:  Negative for abdominal pain, diarrhea, nausea and vomiting.  Endocrine: Negative for polydipsia and polyuria.  Genitourinary:  Negative for dysuria and hematuria.  Musculoskeletal:  Positive for arthralgias. Negative for neck pain and neck stiffness.  Skin:  Positive for rash.   Neurological:  Negative for dizziness and weakness.  Psychiatric/Behavioral:  Negative for agitation and behavioral problems.        Objective:    Physical Exam Vitals reviewed.  Constitutional:      General: She is not in acute distress.    Appearance: She is not diaphoretic.  HENT:     Head: Normocephalic and atraumatic.     Nose: Nose normal.     Mouth/Throat:     Mouth: Mucous membranes are moist.   Eyes:     General: No scleral icterus.    Extraocular Movements: Extraocular movements intact.    Cardiovascular:     Rate and Rhythm: Normal rate and regular rhythm.     Heart sounds: Normal heart sounds. No murmur heard. Pulmonary:     Breath sounds: Normal breath sounds. No wheezing or rales.   Musculoskeletal:     Cervical back: Neck supple. No tenderness.     Right lower leg: No edema.     Left lower leg: No edema.   Skin:    General: Skin is warm.     Findings: Rash (Vesicular rash over erythematous base over left buttock and upper thigh area) present.   Neurological:     General: No focal deficit present.     Mental Status: She is alert and oriented to person, place, and time.   Psychiatric:        Mood and Affect: Mood normal.        Behavior: Behavior normal.     BP 136/78 (BP Location: Left Arm)   Pulse 79   Ht 5' 2 (1.575 m)   Wt 87 lb (39.5 kg)   SpO2 96%   BMI 15.91 kg/m  Wt Readings from Last 3 Encounters:  01/12/24 87 lb (39.5 kg)  11/06/23 85 lb (38.6 kg)  09/01/23 89 lb 3.2 oz (40.5 kg)        Assessment & Plan:   Problem List Items Addressed This Visit       Nervous and Auditory   Herpes zoster without complication - Primary   Started valacyclovir 1 g 3 times daily Oral prednisone  40 mg QD x 5 days Advised to apply Zostrix or customizing cream for local relief If persistent pain, can consider gabapentin      Relevant Medications   valACYclovir (VALTREX) 1000 MG tablet   predniSONE  (DELTASONE ) 20 MG tablet     Meds  ordered this encounter  Medications   valACYclovir (VALTREX) 1000 MG tablet    Sig: Take 1 tablet (1,000 mg total) by mouth 3 (three) times daily for 10 days.    Dispense:  30 tablet    Refill:  0   predniSONE  (DELTASONE ) 20 MG tablet    Sig: Take 2 tablets (40 mg total) by mouth daily with breakfast.    Dispense:  10 tablet    Refill:  0     Ewin Rehberg MARLA Blanch, MD

## 2024-01-12 NOTE — Patient Instructions (Addendum)
 Please start taking Valacyclovir and Prednisone  as prescribed.  Please apply Zostrix or Capsaicin over local area for pain relief.

## 2024-01-12 NOTE — Assessment & Plan Note (Signed)
 Started valacyclovir 1 g 3 times daily Oral prednisone  40 mg QD x 5 days Advised to apply Zostrix or customizing cream for local relief If persistent pain, can consider gabapentin

## 2024-01-19 NOTE — Telephone Encounter (Signed)
 Opened in error

## 2024-02-10 ENCOUNTER — Other Ambulatory Visit: Payer: Self-pay

## 2024-02-10 DIAGNOSIS — F419 Anxiety disorder, unspecified: Secondary | ICD-10-CM

## 2024-04-11 ENCOUNTER — Other Ambulatory Visit: Payer: Self-pay

## 2024-04-11 DIAGNOSIS — F419 Anxiety disorder, unspecified: Secondary | ICD-10-CM

## 2024-04-14 ENCOUNTER — Ambulatory Visit

## 2024-04-14 ENCOUNTER — Encounter (HOSPITAL_COMMUNITY)

## 2024-04-19 ENCOUNTER — Other Ambulatory Visit: Payer: Self-pay

## 2024-04-19 DIAGNOSIS — E7849 Other hyperlipidemia: Secondary | ICD-10-CM

## 2024-04-19 MED ORDER — ROSUVASTATIN CALCIUM 20 MG PO TABS: 20.0000 mg | ORAL_TABLET | Freq: Every day | ORAL | 3 refills | Status: DC

## 2024-04-24 ENCOUNTER — Other Ambulatory Visit: Payer: Self-pay

## 2024-04-24 DIAGNOSIS — M12811 Other specific arthropathies, not elsewhere classified, right shoulder: Secondary | ICD-10-CM

## 2024-05-01 ENCOUNTER — Other Ambulatory Visit: Payer: Self-pay

## 2024-05-01 DIAGNOSIS — I10 Essential (primary) hypertension: Secondary | ICD-10-CM

## 2024-05-08 ENCOUNTER — Other Ambulatory Visit: Payer: Self-pay

## 2024-05-08 DIAGNOSIS — M12811 Other specific arthropathies, not elsewhere classified, right shoulder: Secondary | ICD-10-CM

## 2024-06-02 ENCOUNTER — Other Ambulatory Visit: Payer: Self-pay

## 2024-06-02 DIAGNOSIS — F419 Anxiety disorder, unspecified: Secondary | ICD-10-CM

## 2024-06-28 ENCOUNTER — Other Ambulatory Visit: Payer: Self-pay

## 2024-06-28 DIAGNOSIS — M12811 Other specific arthropathies, not elsewhere classified, right shoulder: Secondary | ICD-10-CM

## 2024-07-26 ENCOUNTER — Other Ambulatory Visit: Payer: Self-pay

## 2024-07-26 DIAGNOSIS — I10 Essential (primary) hypertension: Secondary | ICD-10-CM

## 2024-07-27 ENCOUNTER — Other Ambulatory Visit: Payer: Self-pay

## 2024-07-27 DIAGNOSIS — E7849 Other hyperlipidemia: Secondary | ICD-10-CM

## 2024-07-27 NOTE — Telephone Encounter (Signed)
 Copied from CRM [id number]. Topic: Clinical - Medication Refill >> Jul 27, 2024  4:41 PM [name] wrote: Medication: rosuvastatin  (CRESTOR ) 20 MG tablet [[id number]] - 100 day supply   Has the patient contacted their pharmacy? No (Agent: If no, request that the patient contact the pharmacy for the refill. If patient does not wish to contact the pharmacy document the reason why and proceed with request.) (Agent: If yes, when and what did the pharmacy advise?)  This is the patient's preferred pharmacy:  [facility] [address] [place] - [address] [place] [address] Phone: [phone] Fax: [phone]  Is this the correct pharmacy for this prescription? No If no, delete pharmacy and type the correct one.   Has the prescription been filled recently? Yes  Is the patient out of the medication? No  Has the patient been seen for an appointment in the last year OR does the patient have an upcoming appointment? Yes  Can we respond through MyChart? No  Agent: Please be advised that Rx refills may take up to 3 business days. We ask that you follow-up with your pharmacy.

## 2024-07-28 MED ORDER — ROSUVASTATIN CALCIUM 20 MG PO TABS: 20.0000 mg | ORAL_TABLET | Freq: Every day | ORAL | 3 refills | Status: AC

## 2024-07-29 ENCOUNTER — Other Ambulatory Visit: Payer: Self-pay

## 2024-07-29 DIAGNOSIS — F419 Anxiety disorder, unspecified: Secondary | ICD-10-CM

## 2024-08-03 ENCOUNTER — Ambulatory Visit: Admitting: Orthopedic Surgery

## 2024-08-03 ENCOUNTER — Encounter: Payer: Self-pay | Admitting: Orthopedic Surgery

## 2024-08-03 DIAGNOSIS — M12812 Other specific arthropathies, not elsewhere classified, left shoulder: Secondary | ICD-10-CM | POA: Diagnosis not present

## 2024-08-03 DIAGNOSIS — M12811 Other specific arthropathies, not elsewhere classified, right shoulder: Secondary | ICD-10-CM | POA: Diagnosis not present

## 2024-08-03 MED ORDER — METHYLPREDNISOLONE ACETATE 40 MG/ML IJ SUSP
40.0000 mg | Freq: Once | INTRAMUSCULAR | Status: AC
  Administered 2024-08-03: 40 mg via INTRA_ARTICULAR

## 2024-08-03 NOTE — Progress Notes (Unsigned)
 Return patient Visit  Assessment: Rita Yang is a 74 y.o. female with the following: 1. Rotator cuff arthropathy of right shoulder 2. Rotator cuff arthropathy of left shoulder  Plan: Rita Yang has recurrence of the pain in her shoulders.  She has a rotator cuff arthropathy bilaterally.  Right is worse than left.  Prior injection was helpful until a couple weeks ago.  She is interested in repeat injections today.  I would like to wait 3 months until the next injections.  Procedure note injection - Right shoulder    Verbal consent was obtained to inject the right shoulder, subacromial space Timeout was completed to confirm the site of injection.   The skin was prepped with alcohol and ethyl chloride was sprayed at the injection site.  A 21-gauge needle was used to inject 40 mg of Depo-Medrol  and 1% lidocaine  (4 cc) into the subacromial space of the right shoulder using a posterolateral approach.  There were no complications.  A sterile bandage was applied.    Procedure note injection Left shoulder    Verbal consent was obtained to inject the left shoulder, subacromial space Timeout was completed to confirm the site of injection.  The skin was prepped with alcohol and ethyl chloride was sprayed at the injection site.  A 21-gauge needle was used to inject 40 mg of Depo-Medrol  and 1% lidocaine  (4 cc) into the subacromial space of the left shoulder using a posterolateral approach.  There were no complications. A sterile bandage was applied.   Follow-up: No follow-ups on file.  Subjective:  Chief Complaint  Patient presents with   Injections    Both shoulders     History of Present Illness: Rita Yang is a 74 y.o. female who returns to clinic for repeat evaluation of bilateral shoulder pain.  Right is worse than left.  I saw her a couple months ago.  At that time, both shoulders were injected.  She states that she had excellent improvement in her symptoms until a couple of weeks  ago.  No specific injury.  The pain is returned.  She would like to repeat injections.  Review of Systems: No fevers or chills No numbness or tingling No chest pain No shortness of breath No bowel or bladder dysfunction No GI distress No headaches    Objective: There were no vitals taken for this visit.  Physical Exam:  General: Alert and oriented. and No acute distress. Gait: Normal gait.  Thin female.  Bilateral shoulders without deformity.  No redness.  No swelling.  Forward flexion limited to 120 degrees on the right.  130 degrees of forward flexion on the left.  There is some crepitus.  Fingers are warm and well-perfused.  Sensation is intact in the axillary nerve distribution.  IMAGING: I personally ordered and reviewed the following images     New Medications:  Meds ordered this encounter  Medications   methylPREDNISolone  acetate (DEPO-MEDROL ) injection 40 mg   methylPREDNISolone  acetate (DEPO-MEDROL ) injection 40 mg      Oneil DELENA Horde, MD  08/03/2024 2:09 PM

## 2024-08-03 NOTE — Patient Instructions (Signed)
